# Patient Record
Sex: Female | Born: 1975
Health system: Southern US, Community
[De-identification: ages and names within clinical notes are randomized; demographics above are authoritative.]

## PROBLEM LIST (undated history)

## (undated) DIAGNOSIS — Z975 Presence of (intrauterine) contraceptive device: Secondary | ICD-10-CM

## (undated) DIAGNOSIS — I7300 Raynaud's syndrome without gangrene: Secondary | ICD-10-CM

## (undated) DIAGNOSIS — E079 Disorder of thyroid, unspecified: Secondary | ICD-10-CM

## (undated) DIAGNOSIS — I8390 Asymptomatic varicose veins of unspecified lower extremity: Secondary | ICD-10-CM

## (undated) DIAGNOSIS — R896 Abnormal cytological findings in specimens from other organs, systems and tissues: Secondary | ICD-10-CM

## (undated) DIAGNOSIS — B001 Herpesviral vesicular dermatitis: Secondary | ICD-10-CM

## (undated) DIAGNOSIS — I739 Peripheral vascular disease, unspecified: Secondary | ICD-10-CM

## (undated) HISTORY — DX: Raynaud's syndrome without gangrene: I73.00

## (undated) HISTORY — DX: Herpesviral vesicular dermatitis: B00.1

## (undated) HISTORY — DX: Asymptomatic varicose veins of unspecified lower extremity: I83.90

## (undated) HISTORY — DX: Disorder of thyroid, unspecified: E07.9

## (undated) HISTORY — DX: Presence of (intrauterine) contraceptive device: Z97.5

## (undated) HISTORY — DX: Abnormal cytological findings in specimens from other organs, systems and tissues: R89.6

## (undated) HISTORY — PX: OTHER SURGICAL HISTORY: SHX169

## (undated) HISTORY — DX: Peripheral vascular disease, unspecified: I73.9

---
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
## 1999-10-02 ENCOUNTER — Other Ambulatory Visit: Admission: RE | Admit: 1999-10-02 | Discharge: 1999-10-02 | Payer: Self-pay | Admitting: Obstetrics and Gynecology

## 2000-10-06 ENCOUNTER — Other Ambulatory Visit: Admission: RE | Admit: 2000-10-06 | Discharge: 2000-10-06 | Payer: Self-pay | Admitting: Obstetrics and Gynecology

## 2001-06-07 ENCOUNTER — Other Ambulatory Visit: Admission: RE | Admit: 2001-06-07 | Discharge: 2001-06-07 | Payer: Self-pay | Admitting: Gynecology

## 2001-06-10 ENCOUNTER — Inpatient Hospital Stay (HOSPITAL_COMMUNITY): Admission: AD | Admit: 2001-06-10 | Discharge: 2001-06-10 | Payer: Self-pay | Admitting: Gynecology

## 2002-01-04 ENCOUNTER — Inpatient Hospital Stay (HOSPITAL_COMMUNITY): Admission: AD | Admit: 2002-01-04 | Discharge: 2002-01-07 | Payer: Self-pay | Admitting: Gynecology

## 2002-01-04 ENCOUNTER — Encounter (INDEPENDENT_AMBULATORY_CARE_PROVIDER_SITE_OTHER): Payer: Self-pay | Admitting: *Deleted

## 2002-01-09 ENCOUNTER — Encounter: Admission: RE | Admit: 2002-01-09 | Discharge: 2002-02-08 | Payer: Self-pay | Admitting: Gynecology

## 2002-02-15 ENCOUNTER — Other Ambulatory Visit: Admission: RE | Admit: 2002-02-15 | Discharge: 2002-02-15 | Payer: Self-pay | Admitting: Gynecology

## 2003-05-09 ENCOUNTER — Other Ambulatory Visit: Admission: RE | Admit: 2003-05-09 | Discharge: 2003-05-09 | Payer: Self-pay | Admitting: Gynecology

## 2003-10-09 ENCOUNTER — Other Ambulatory Visit: Admission: RE | Admit: 2003-10-09 | Discharge: 2003-10-09 | Payer: Self-pay | Admitting: Gynecology

## 2004-04-28 ENCOUNTER — Encounter (INDEPENDENT_AMBULATORY_CARE_PROVIDER_SITE_OTHER): Payer: Self-pay | Admitting: Specialist

## 2004-04-28 ENCOUNTER — Inpatient Hospital Stay (HOSPITAL_COMMUNITY): Admission: AD | Admit: 2004-04-28 | Discharge: 2004-05-01 | Payer: Self-pay | Admitting: Gynecology

## 2004-06-26 ENCOUNTER — Other Ambulatory Visit: Admission: RE | Admit: 2004-06-26 | Discharge: 2004-06-26 | Payer: Self-pay | Admitting: Gynecology

## 2005-06-21 DIAGNOSIS — IMO0001 Reserved for inherently not codable concepts without codable children: Secondary | ICD-10-CM

## 2005-06-21 HISTORY — DX: Reserved for inherently not codable concepts without codable children: IMO0001

## 2005-07-02 ENCOUNTER — Other Ambulatory Visit: Admission: RE | Admit: 2005-07-02 | Discharge: 2005-07-02 | Payer: Self-pay | Admitting: Gynecology

## 2005-09-04 ENCOUNTER — Ambulatory Visit (HOSPITAL_BASED_OUTPATIENT_CLINIC_OR_DEPARTMENT_OTHER): Admission: RE | Admit: 2005-09-04 | Discharge: 2005-09-04 | Payer: Self-pay | Admitting: Gynecology

## 2005-09-04 ENCOUNTER — Encounter (INDEPENDENT_AMBULATORY_CARE_PROVIDER_SITE_OTHER): Payer: Self-pay | Admitting: Specialist

## 2006-04-09 ENCOUNTER — Other Ambulatory Visit: Admission: RE | Admit: 2006-04-09 | Discharge: 2006-04-09 | Payer: Self-pay | Admitting: Gynecology

## 2006-06-28 ENCOUNTER — Emergency Department (HOSPITAL_COMMUNITY): Admission: EM | Admit: 2006-06-28 | Discharge: 2006-06-28 | Payer: Self-pay | Admitting: Family Medicine

## 2006-10-19 ENCOUNTER — Ambulatory Visit: Payer: Self-pay | Admitting: Internal Medicine

## 2006-10-19 LAB — CONVERTED CEMR LAB
ALT: 15 units/L (ref 0–35)
AST: 19 units/L (ref 0–37)
Albumin: 4.2 g/dL (ref 3.5–5.2)
Alkaline Phosphatase: 68 units/L (ref 39–117)
Amylase: 29 units/L (ref 27–131)
BUN: 8 mg/dL (ref 6–23)
Bacteria, UA: NEGATIVE
Basophils Absolute: 0 10*3/uL (ref 0.0–0.1)
Basophils Relative: 0.4 % (ref 0.0–1.0)
Bilirubin Urine: NEGATIVE
Bilirubin, Direct: 0.1 mg/dL (ref 0.0–0.3)
CO2: 29 meq/L (ref 19–32)
Calcium: 9.5 mg/dL (ref 8.4–10.5)
Chloride: 103 meq/L (ref 96–112)
Creatinine, Ser: 0.6 mg/dL (ref 0.4–1.2)
Crystals: NEGATIVE
Eosinophils Absolute: 0.1 10*3/uL (ref 0.0–0.6)
Eosinophils Relative: 1.7 % (ref 0.0–5.0)
GFR calc Af Amer: 148 mL/min
GFR calc non Af Amer: 122 mL/min
Glucose, Bld: 98 mg/dL (ref 70–99)
HCT: 40.5 % (ref 36.0–46.0)
Hemoglobin, Urine: NEGATIVE
Hemoglobin: 14 g/dL (ref 12.0–15.0)
Ketones, ur: NEGATIVE mg/dL
Leukocytes, UA: NEGATIVE
Lipase: 23 units/L (ref 11.0–59.0)
Lymphocytes Relative: 30.1 % (ref 12.0–46.0)
MCHC: 34.6 g/dL (ref 30.0–36.0)
MCV: 94 fL (ref 78.0–100.0)
Monocytes Absolute: 0.4 10*3/uL (ref 0.2–0.7)
Monocytes Relative: 5.2 % (ref 3.0–11.0)
Mucus, UA: NEGATIVE
Neutro Abs: 5 10*3/uL (ref 1.4–7.7)
Neutrophils Relative %: 62.6 % (ref 43.0–77.0)
Nitrite: NEGATIVE
Platelets: 185 10*3/uL (ref 150–400)
Potassium: 3.6 meq/L (ref 3.5–5.1)
RBC / HPF: NONE SEEN
RBC: 4.31 M/uL (ref 3.87–5.11)
RDW: 11.9 % (ref 11.5–14.6)
Sed Rate: 8 mm/hr (ref 0–25)
Sodium: 137 meq/L (ref 135–145)
Specific Gravity, Urine: 1.01 (ref 1.000–1.03)
TSH: 2.28 microintl units/mL (ref 0.35–5.50)
Total Bilirubin: 1.2 mg/dL (ref 0.3–1.2)
Total Protein, Urine: NEGATIVE mg/dL
Total Protein: 6.8 g/dL (ref 6.0–8.3)
Urine Glucose: NEGATIVE mg/dL
Urobilinogen, UA: 0.2 (ref 0.0–1.0)
WBC, UA: NONE SEEN cells/hpf
WBC: 7.8 10*3/uL (ref 4.5–10.5)
pH: 7 (ref 5.0–8.0)

## 2006-11-08 ENCOUNTER — Ambulatory Visit (HOSPITAL_COMMUNITY): Admission: RE | Admit: 2006-11-08 | Discharge: 2006-11-08 | Payer: Self-pay | Admitting: Internal Medicine

## 2007-04-26 ENCOUNTER — Other Ambulatory Visit: Admission: RE | Admit: 2007-04-26 | Discharge: 2007-04-26 | Payer: Self-pay | Admitting: Gynecology

## 2007-07-01 ENCOUNTER — Emergency Department (HOSPITAL_COMMUNITY): Admission: EM | Admit: 2007-07-01 | Discharge: 2007-07-01 | Payer: Self-pay | Admitting: Emergency Medicine

## 2007-07-05 DIAGNOSIS — R109 Unspecified abdominal pain: Secondary | ICD-10-CM | POA: Insufficient documentation

## 2007-07-05 DIAGNOSIS — R1110 Vomiting, unspecified: Secondary | ICD-10-CM | POA: Insufficient documentation

## 2007-07-05 DIAGNOSIS — N840 Polyp of corpus uteri: Secondary | ICD-10-CM | POA: Insufficient documentation

## 2007-07-05 DIAGNOSIS — K828 Other specified diseases of gallbladder: Secondary | ICD-10-CM | POA: Insufficient documentation

## 2007-11-22 IMAGING — US US ABDOMEN COMPLETE
1 series · 14 of 25 positions shown · non-contrast
Comparison: None.

CLINICAL DATA: Abdominal pain.  
 ABDOMEN ULTRASOUND:
TECHNIQUE: Complete abdominal ultrasound examination was performed including evaluation of the liver, gallbladder, bile ducts, pancreas, kidneys, spleen, IVC, and abdominal aorta.

[Series 1: unknown · 0.27mm/px · 14 of 73 slices shown]
[im 1/73]
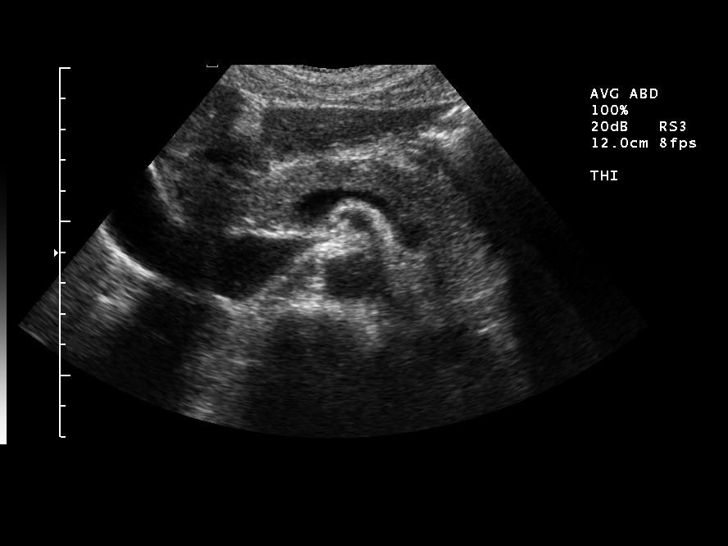
[im 7/73]
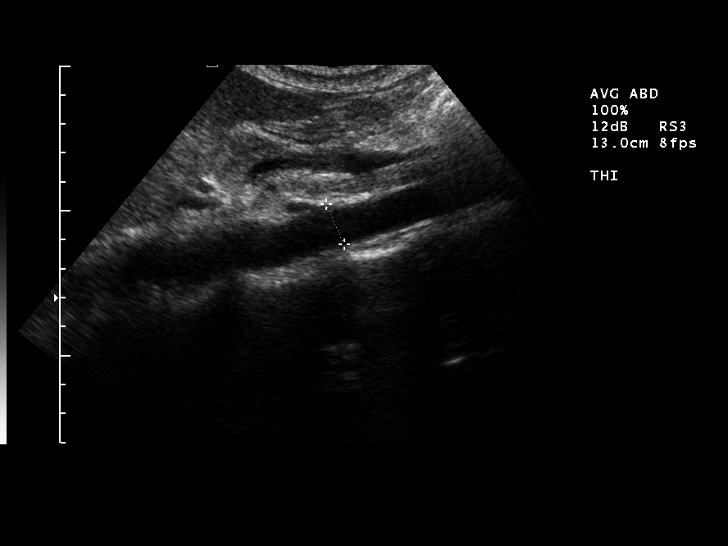
[im 13/73]
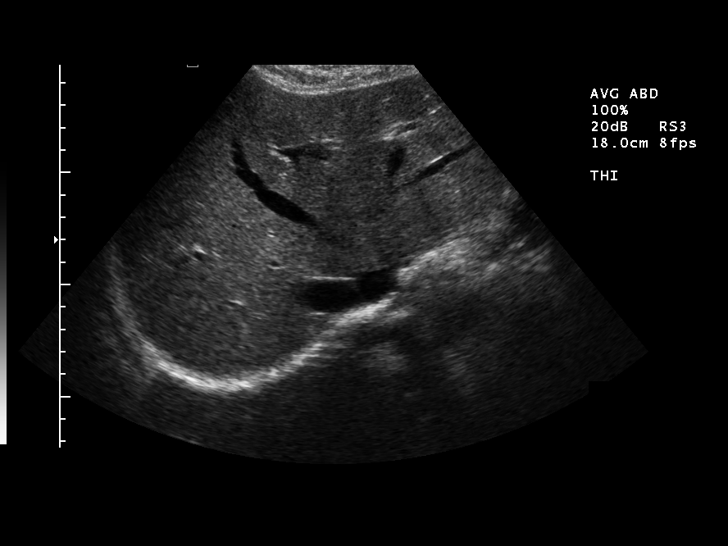
[im 19/73]
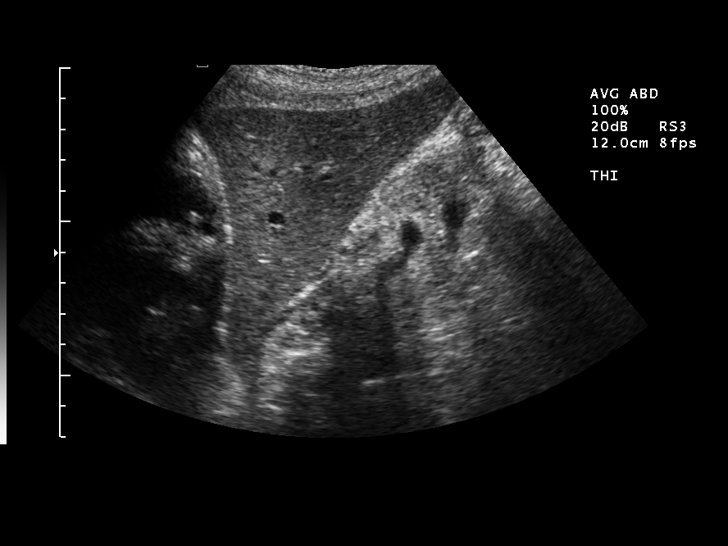
[im 25/73]
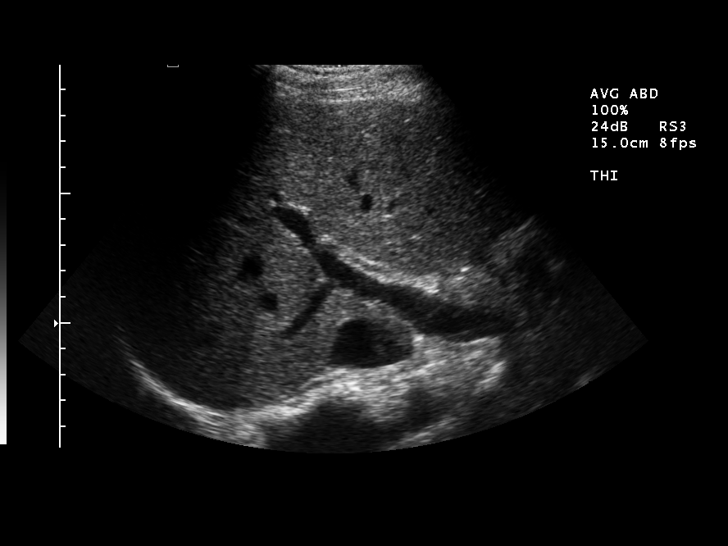
[im 28/73]
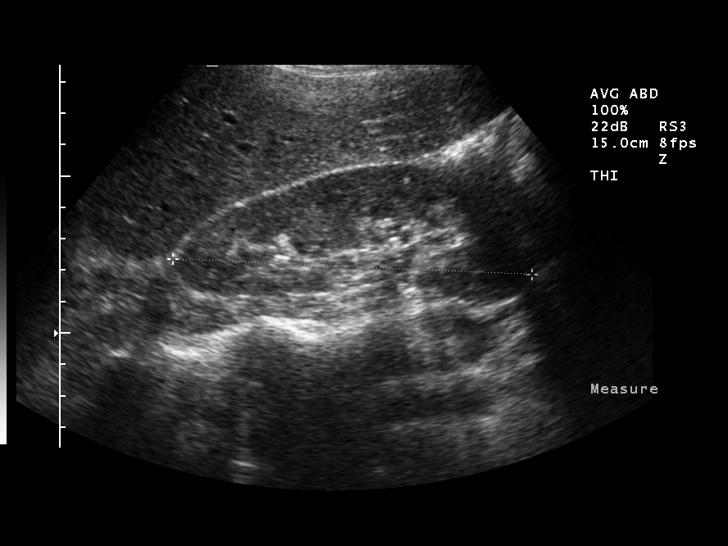
[im 34/73]
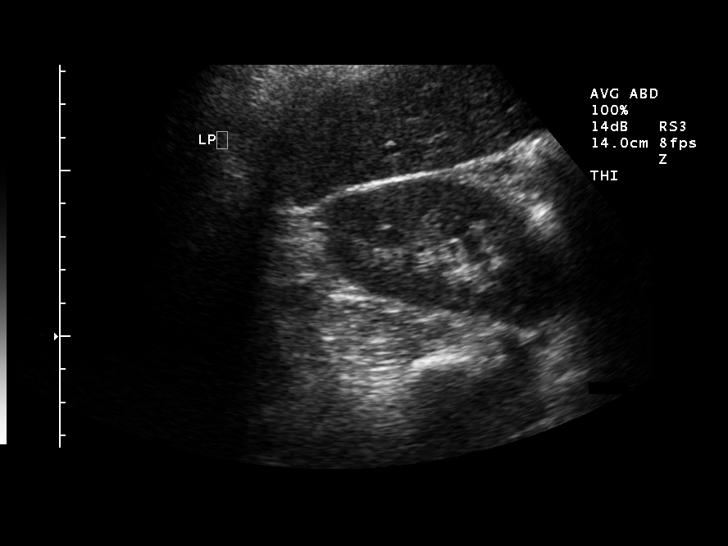
[im 40/73]
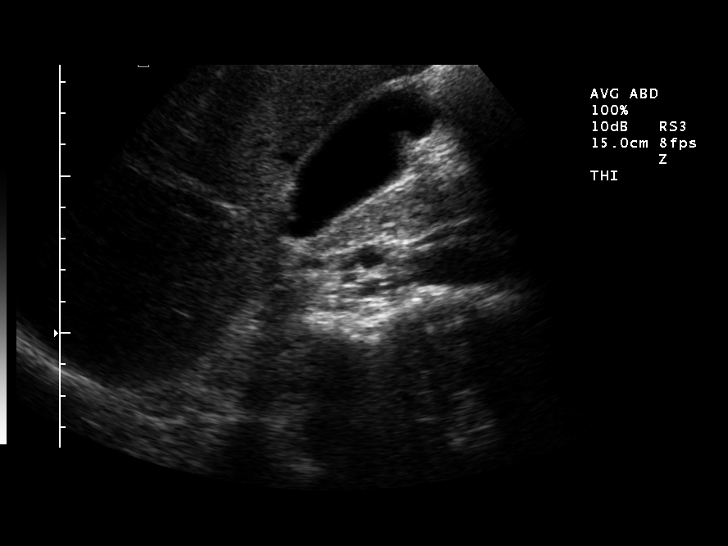
[im 46/73]
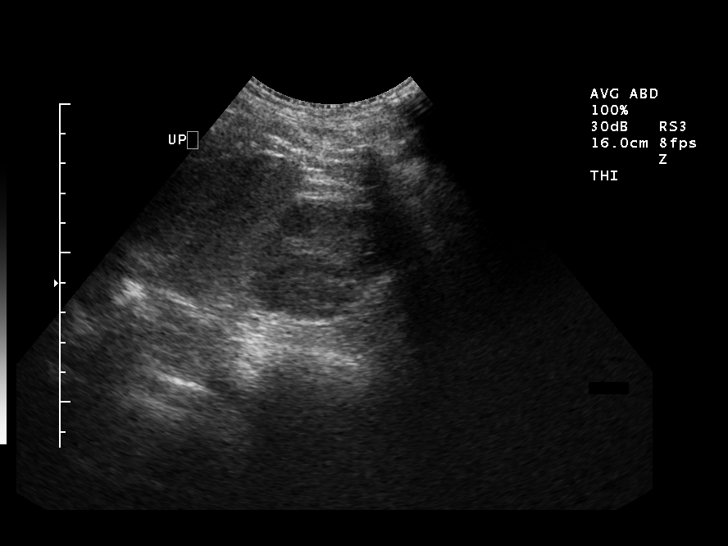
[im 49/73]
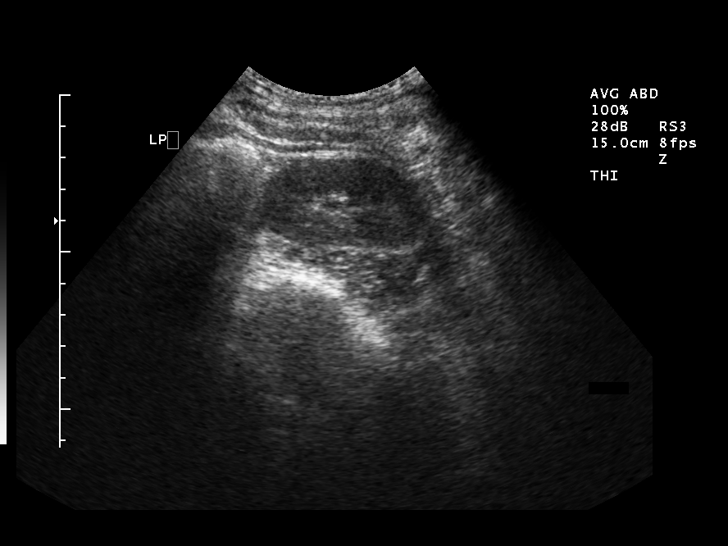
[im 55/73]
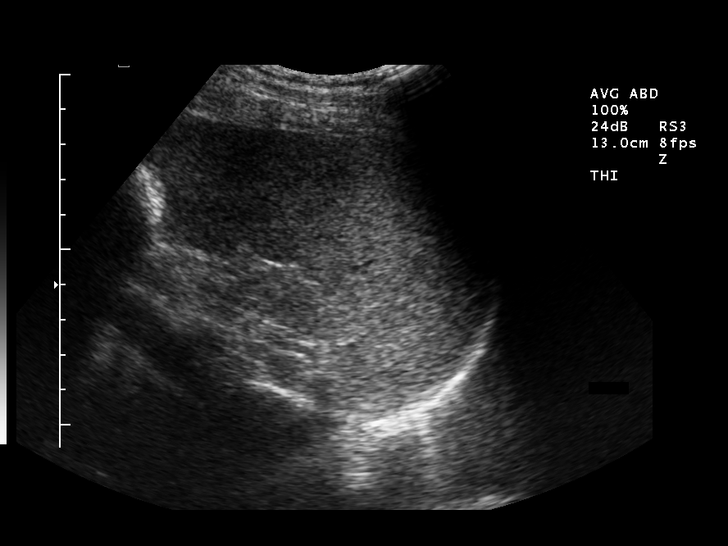
[im 61/73]
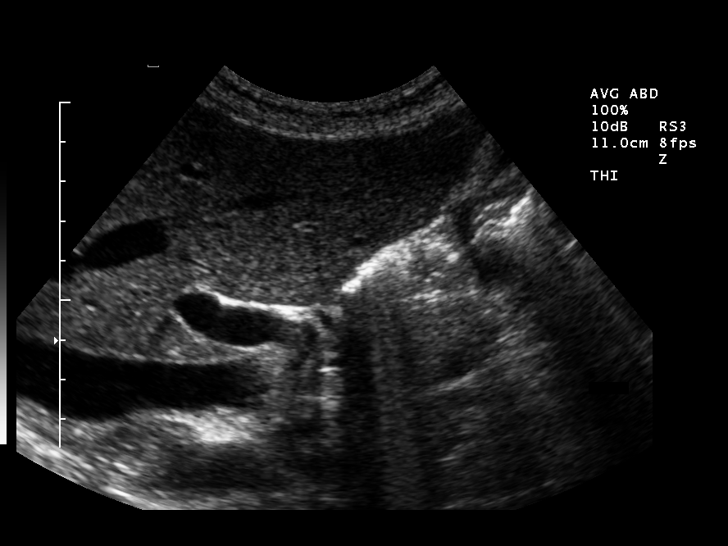
[im 67/73]
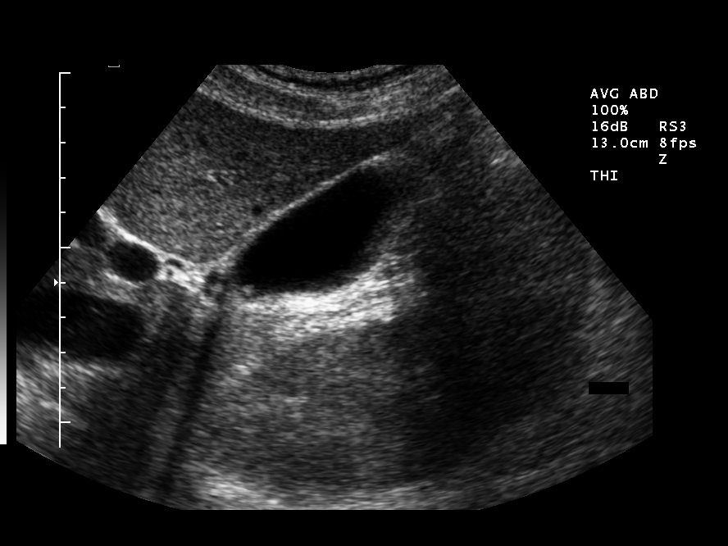
[im 73/73]
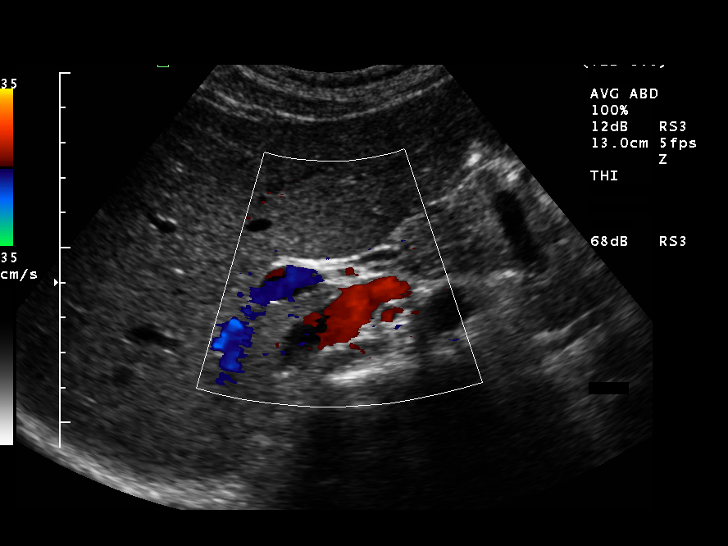

[14 of 25 positions shown; findings below may reference images not displayed]

FINDINGS: Gallbladder size and contour normal.  There are two small polyps of the gallbladder wall measuring about 3 mm.  No definite changes of adenomyomatosis.  No stones or wall thickening.  No focal tenderness over the gallbladder. 
 Intra and extrahepatic bile ducts normal in caliber.  Pancreas, spleen, kidneys, aorta, and IVC normal.  No ascites.
IMPRESSION: Normal except for several small gallbladder polyps.

## 2008-07-14 IMAGING — CT CT HEAD W/O CM
1 series · 16 of 28 positions shown, 20 images · non-contrast
Comparison: None

CLINICAL DATA: Headache, confusion, history of seizures for many
years

CT HEAD WITHOUT CONTRAST
TECHNIQUE: Contiguous axial images were obtained from the base of
the skull through the vertex without contrast.

[Series 2: brain · axial · 0.47mm/px · z∈[+95,+220]mm · 16 of 28 slices shown, 20 images]
[im 2/28  brain]
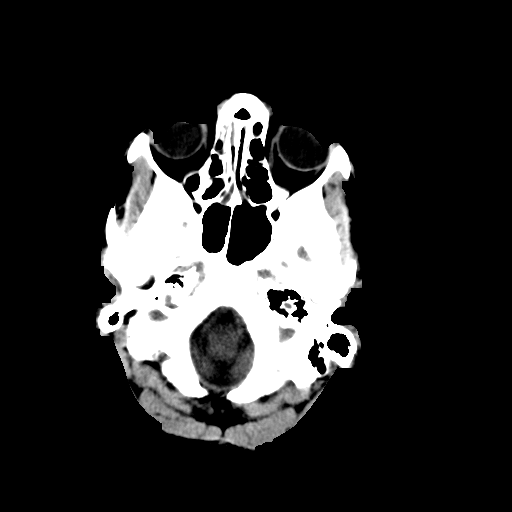
[im 2/28  bone]
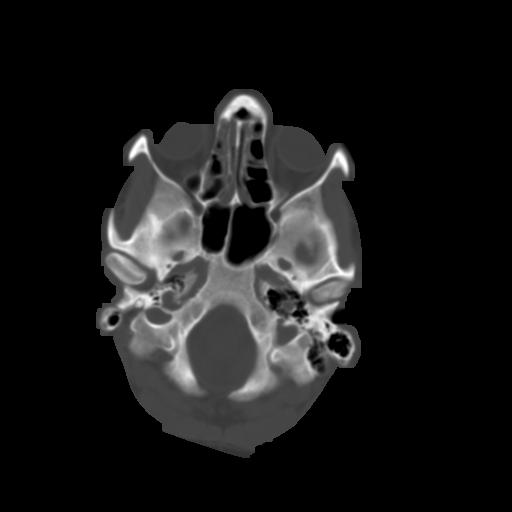
[im 4/28  brain]
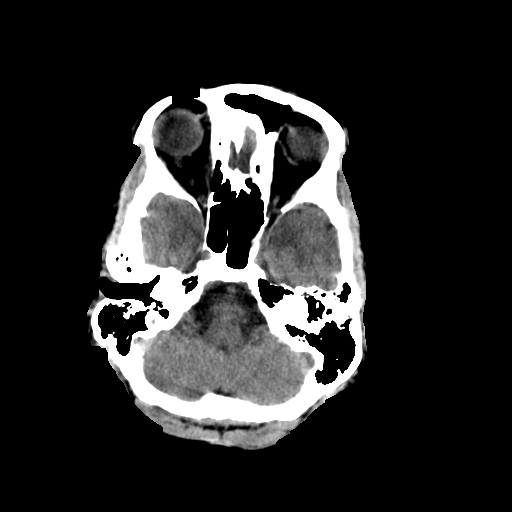
[im 6/28  brain]
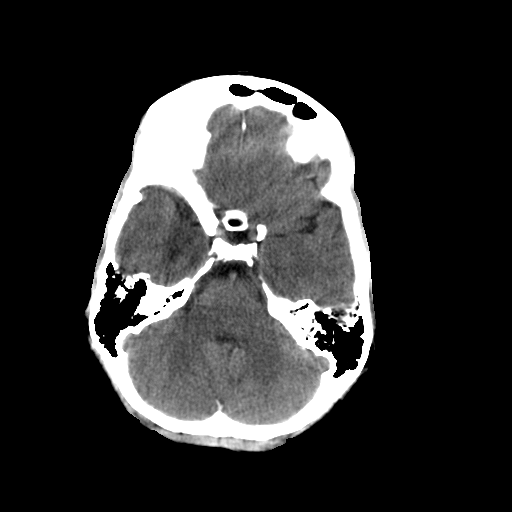
[im 7/28  brain]
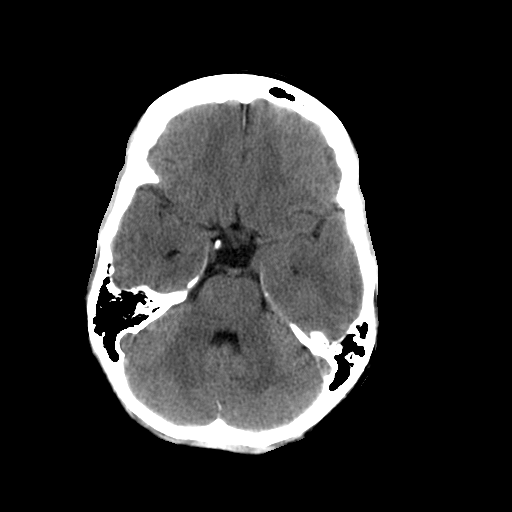
[im 9/28  brain]
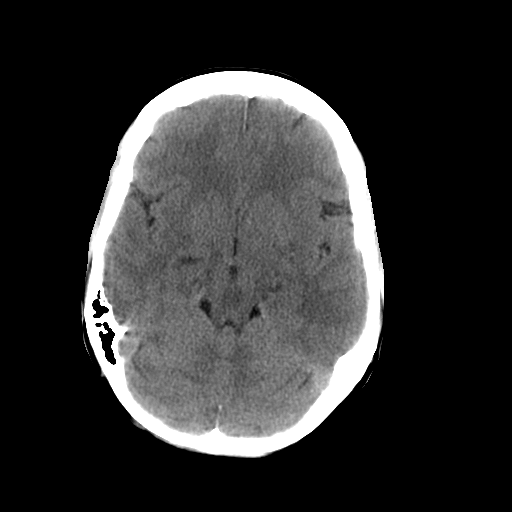
[im 9/28  bone]
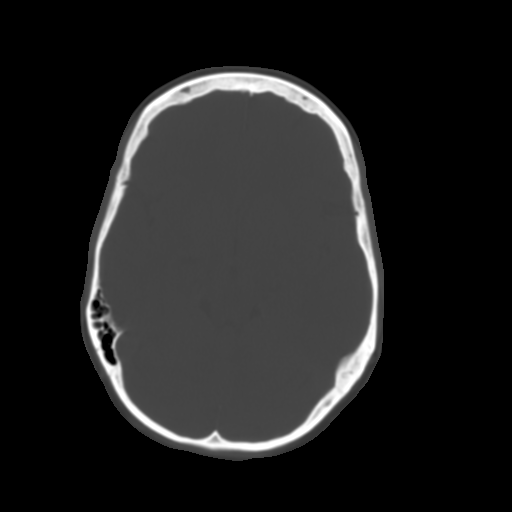
[im 10/28  brain]
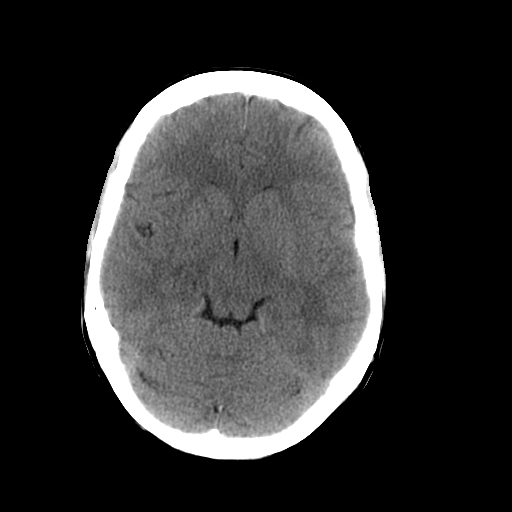
[im 12/28  brain]
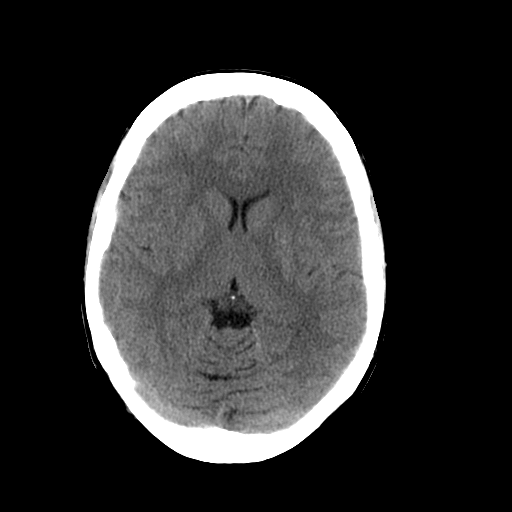
[im 14/28  brain]
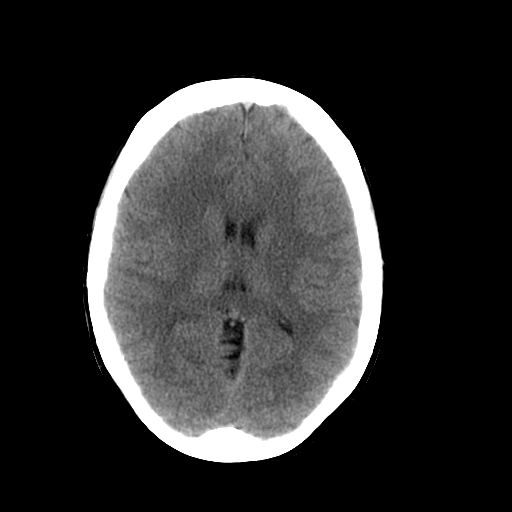
[im 15/28  brain]
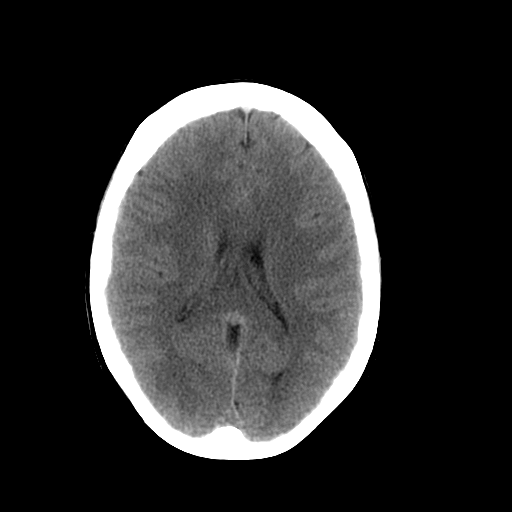
[im 15/28  bone]
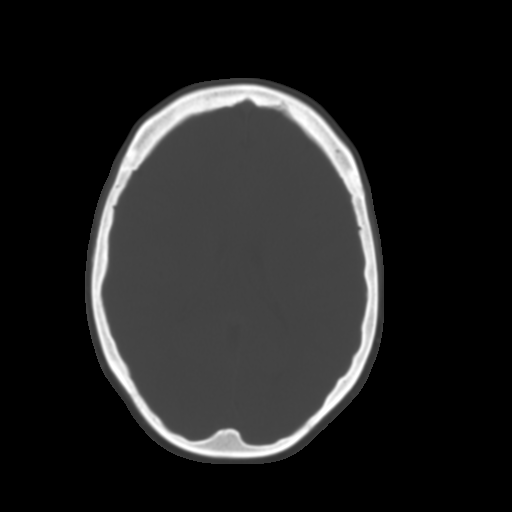
[im 17/28  brain]
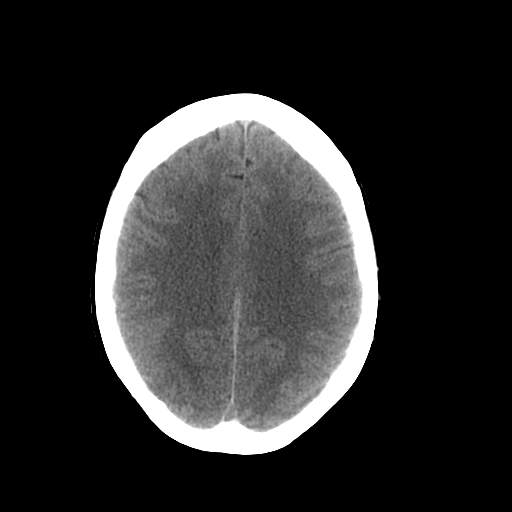
[im 19/28  brain]
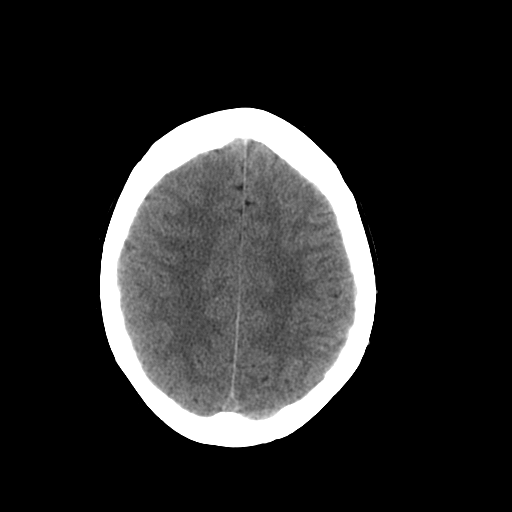
[im 20/28  brain]
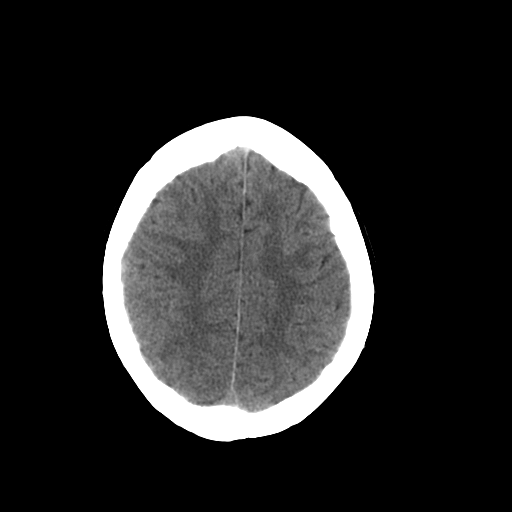
[im 22/28  brain]
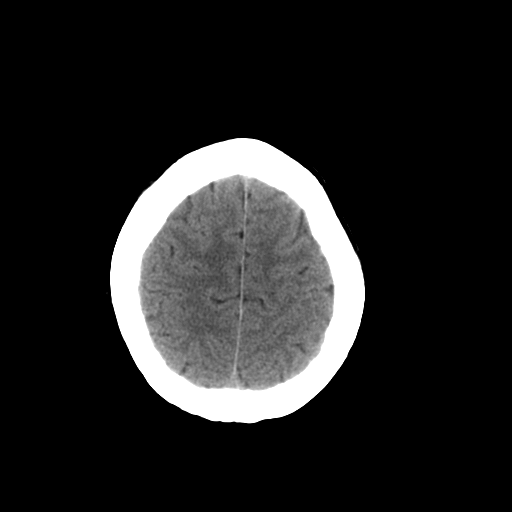
[im 22/28  bone]
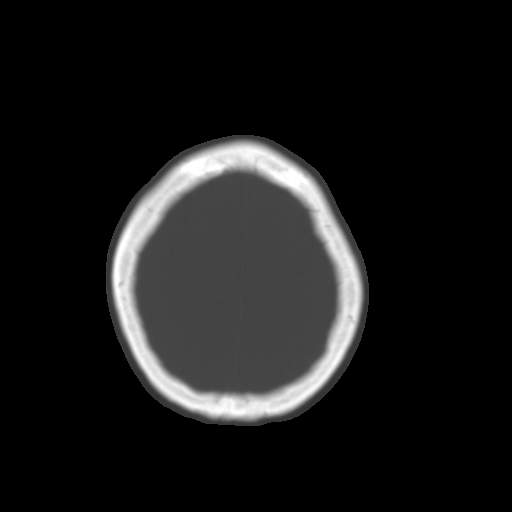
[im 23/28  brain]
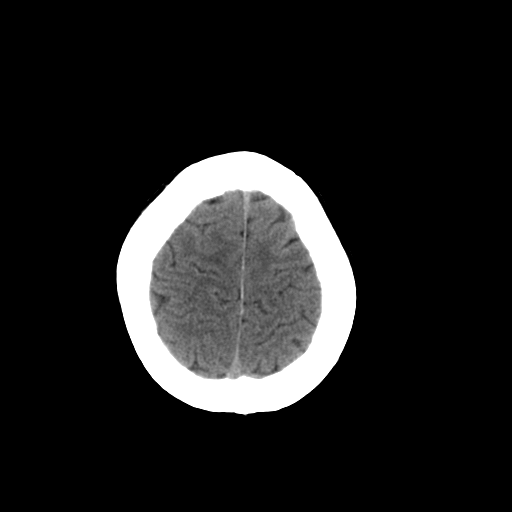
[im 25/28  brain]
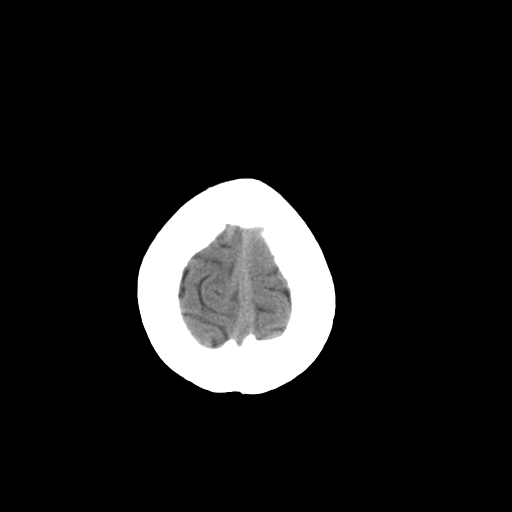
[im 27/28  brain]
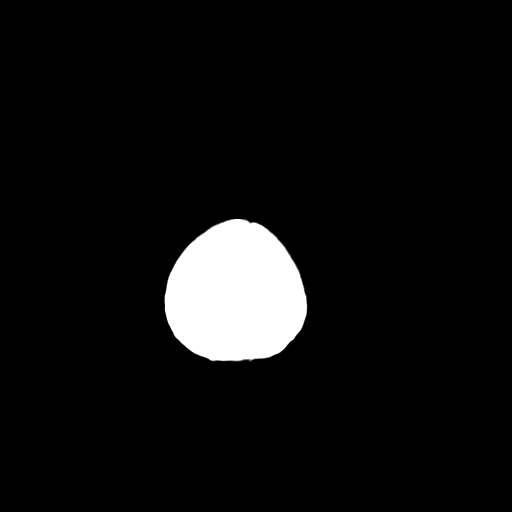

[16 of 28 positions shown; findings below may reference images not displayed]

FINDINGS: The ventricular system is normal in size and
configuration and the septum is in a normal midline position.  The
fourth ventricle and basilar cisterns appear normal.  No blood,
edema, or mass effect is seen.  On bone window images no bony
abnormality is seen.  However, there is evidence of ethmoid sinus
disease.
IMPRESSION: 1.  No acute intracranial abnormality.
2.  Ethmoid sinus disease.

## 2008-12-07 ENCOUNTER — Encounter: Payer: Self-pay | Admitting: Gynecology

## 2008-12-07 ENCOUNTER — Other Ambulatory Visit: Admission: RE | Admit: 2008-12-07 | Discharge: 2008-12-07 | Payer: Self-pay | Admitting: Gynecology

## 2008-12-07 ENCOUNTER — Ambulatory Visit: Payer: Self-pay | Admitting: Gynecology

## 2010-03-10 ENCOUNTER — Ambulatory Visit: Payer: Self-pay | Admitting: Gynecology

## 2010-03-10 ENCOUNTER — Other Ambulatory Visit
Admission: RE | Admit: 2010-03-10 | Discharge: 2010-03-10 | Payer: Self-pay | Source: Home / Self Care | Admitting: Gynecology

## 2010-03-24 ENCOUNTER — Ambulatory Visit: Payer: Self-pay | Admitting: Gynecology

## 2010-06-17 ENCOUNTER — Ambulatory Visit (INDEPENDENT_AMBULATORY_CARE_PROVIDER_SITE_OTHER): Payer: 59 | Admitting: Gynecology

## 2010-06-17 DIAGNOSIS — N390 Urinary tract infection, site not specified: Secondary | ICD-10-CM

## 2010-06-17 DIAGNOSIS — J018 Other acute sinusitis: Secondary | ICD-10-CM

## 2010-06-17 DIAGNOSIS — R823 Hemoglobinuria: Secondary | ICD-10-CM

## 2010-08-04 HISTORY — PX: HYSTEROSCOPY WITH D & C: SHX1775

## 2010-08-05 NOTE — Assessment & Plan Note (Signed)
[facility]                         GASTROENTEROLOGY OFFICE NOTE   NAME:Gikas, DEMAYA HARDGE                     MRN:          [id number]  DATE:10/19/2006                            DOB:          1975/06/15    The patient is self-referred.   REASON FOR CONSULTATION:  Abdominal pain.   HISTORY OF PRESENT ILLNESS:  This is a 35 year old white female with no  significant past medical history, who presents herself today for  evaluation of persistent recurrent abdominal pain and vomiting.  For the  most part, she has been quite healthy.  She does report developing  reflux symptoms at the time of her most recent pregnancy about 2-1/2  years ago.  Postpartum, symptoms resolved.  She did well until about 6  or 7 months ago, when she began to notice a burning sensation in the  pharyngeal region.  As well, a new type of discomfort described as a  burning in the left upper quadrant.  She initiated Prilosec OTC and had  improvement in the pharyngeal burning; however, the abdominal burning  discomfort persisted.  She also reports that many hours after consuming  wine or coffee, she will develop sharp abdominal pain and subsequent  vomiting.  She has awoken with symptoms.  She can see minor amounts of  blood with repeated episodes of vomiting.  She denies vomiting  undigested food.  Bowel habits are regular without melena or  hematochezia.  Her appetite and weight are unchanged.  The pain does not  radiate.  Current frequency of severe pain with vomiting is about once  per week.  However, she has not had any problems in the past two weeks;  she attributes this to abstinence from alcohol  She denies nonsteroidal  anti-inflammatory drug use.  The patient does feel that shortly after  consuming meals, her discomfort might be improved.   PAST MEDICAL HISTORY:  None.   PAST SURGICAL HISTORY:  1. Cesarean section x2.  2. Extraction of benign polyps from the uterus in  2007.   ALLERGIES:  SULFA.   CURRENT MEDICATIONS:  None.   FAMILY HISTORY:  Father with colon polyps. No panreatitis.   SOCIAL HISTORY:  The patient is married with 2 children.  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  She does not  smoke.  Normally with consume 1-3 glasses of wine during the week, more  on weekends or holidays.   REVIEW OF SYSTEMS:  Per diagnostic evaluation form.   PHYSICAL EXAMINATION:  A well-appearing female in no acute distress.  She is alert and oriented.  Blood pressure is 116/64.  Heart rate is 80 and regular.  Weight is  133.4 pounds.  Height:  She is 5 feet 3-1/2 inches in height.  HEENT:  Sclerae are anicteric.  Conjunctivae are pink.  Oral mucosa is  intact.  There is no adenopathy.  LUNGS:  Clear.  HEART:  Regular.  ABDOMEN:  Soft without tenderness, mass or hernia.  Good bowel sounds  heard.  EXTREMITIES:  Without edema.   IMPRESSION:  This is a  35 year old female with a 6- or 87-month history  of left upper quadrant burning discomfort as well as intermittent sharp  left upper quadrant discomfort followed by nausea and vomiting, symptoms  seemingly exacerbated after consumption of alcohol, possibly improved  with meals.  We will rule out peptic ulcer disease.  Rule out  incompletely treated reflux disease.  Rule out pancreatic, biliary, or  urologic processes.   RECOMMENDATIONS:  1. General laboratories.  2. Urinalysis.  3. Abdominal ultrasound.  4. Upper endoscopy.  5. Empirically initiate Prevacid 30 mg daily.  6. Provide a prescription for Phenergan 25 mg q.6 h. p.r.n. nausea.  7. It should be noted that the patient is going to [place] for two      weeks and will not be able to complete her endoscopy or ultrasound      until returning.     Wilhemina Bonito. Marina Goodell, MD  Electronically Signed    JNP/MedQ  DD: 10/19/2006  DT: 10/20/2006  Job #: [id number]

## 2010-08-08 NOTE — H&P (Signed)
   NAME:  MACK, THURMON                        ACCOUNT NO.:  [id number]   MEDICAL RECORD NO.:  [id number]                   PATIENT TYPE:  INP   LOCATION:  9198                                 FACILITY:  [facility]   PHYSICIAN:  Timothy P. Fontaine, M.D.           DATE OF BIRTH:  1975-10-28   DATE OF ADMISSION:  01/04/2002  DATE OF DISCHARGE:                                HISTORY & PHYSICAL   CHIEF COMPLAINT:  Pregnancy at term, prodromal labor, active HSV outbreak,  positive beta strep carrier.   HISTORY OF PRESENT ILLNESS:  A 35 year old G1, P0 female at term gestation  who presented complaining of prodromal labor.  The patient was found to have  irregular contractions with reactive fetal tracing.  The patient also had  noted over the last several day an itching and tingling on her perineum  consistent with her HSV outbreaks, and on perineal inspection was found to  have several superficial ulcerations on the perineal body consistent with  active HSV.  The options were discussed and she wants to proceed with  cesarean section.   PAST MEDICAL HISTORY:  Seizure disorder for which she is on no medications  and has had no recent seizures.   PAST SURGICAL HISTORY:  Wisdom teeth extraction.   ALLERGIES:  SULFA DRUGS.   REVIEW OF SYSTEMS:  Noncontributory.   SOCIAL HISTORY:  Noncontributory.   FAMILY HISTORY:  Noncontributory.   PHYSICAL EXAMINATION:  ABDOMEN AND PELVIS:  Gravid, vertex presentation,  reactive fetal tracing, irregular contractions.  Perineal inspection with  active HSV lesions.   ASSESSMENT:  A 35 year old G1, P0 female, term gestation, active herpes  simplex virus, prodromal labor.  Options were reviewed.  The patient wants  to proceed with cesarean section.  Risks of cesarean section were discussed  with the patient and her husband to include bleeding, transfusion,  infection, wound complications, opening and draining of incisions, closure  by secondary  intention, internal organ damage including bowel, bladder,  ureters, vessels, and nerves necessitating major exploratory reparative  surgeries in the future, reparative surgeries as well fetal injury.  The  patient's questions are answered to her satisfaction.  She is ready to  proceed with surgery.                                                 Timothy P. Audie Box, M.D.    TPF/MEDQ  D:  01/04/2002  T:  01/04/2002  Job:  [id number]

## 2010-08-08 NOTE — Op Note (Signed)
NAMECLEVELAND, Theresa Mosley              ACCOUNT NO.:  [id number]   MEDICAL RECORD NO.:  [id number]          PATIENT TYPE:  INP   LOCATION:  9147                          [facility]ACILITY:  [facility]   PHYSICIAN:  Timothy P. Fontaine, M.D.DATE OF BIRTH:  [birth year]   DATE OF PROCEDURE:  04/28/2004  DATE OF DISCHARGE:                                 OPERATIVE REPORT   PREOPERATIVE DIAGNOSIS:  Term pregnancy, prior cesarean section, repeat  cesarean section, positive beta Strep carrier, history of HSV type 2  currently inactive.   POSTOPERATIVE DIAGNOSIS:  Term pregnancy, prior cesarean section, repeat  cesarean section, positive beta Strep carrier, history of HSV type 2  currently inactive.   PROCEDURE:  Repeat low transverse cervical cesarean section.   SURGEON:  Timothy P. Fontaine, M.D.   ASSISTANT:  Scrub technician.   ANESTHESIA:  Spinal.   ESTIMATED BLOOD LOSS:  Less than 500 mL.   COMPLICATIONS:  None.   SPECIMENS:  Samples of cord blood, placenta.   FINDINGS:  At 35, normal female infant, Apgars 9 and 9, weight 8 pounds 14  ounce.  Pelvic anatomy noted to be normal.   INDICATIONS FOR PROCEDURE:  35 year old G43, P1 female at [redacted] weeks gestation,  history of prior cesarean section, who desired trial of labor.  The patient  underwent spontaneous rupture of membranes, subsequently required pitocin  augmentation. Despite an adequate labor pattern, the patient's cervix  remained unchanged and the decision was made to proceed with cesarean  section.  The risks of the procedure were reviewed with her to include the  risks of bleeding, transfusion, infection, wound complications, damage to  internal organs including bowel, bladder, ureters, vessels, and nerves,  necessitating major exploratory reparative surgery and future reparative  surgery, as well as fetal injury during the birthing process, all of which  was understood and accepted.   PROCEDURE:  The patient was taken to the operating  room, underwent spinal  anesthesia, was placed in the left tilt supine position, received an  abdominal preparation with Betadine solution.  The bladder was emptied with  indwelling Foley catheterization placed using sterile technique.  The  patient was prepped and draped in the usual fashion.  After assuring  adequate anesthesia, the abdomen was sharply entered through a repeat  Pfannenstiel incision achieving adequate hemostasis at all levels.  The  bladder flap was sharply and bluntly developed and the uterus was sharply  entered in the lower uterine segment bluntly extended laterally.  The  bulging membranes were ruptured, the fluid noted to be clear.  The infants  head was delivered through the incision and the nares and mouth were  suctioned.  The rest of the infant was delivered.  The cord was doubly  clamped and cut.  The infant was handed to pediatrics in attendance.  Samples of cord blood were obtained.  The placenta was spontaneously  extracted and noted to be intact.  The uterus was exteriorized, the  endometrial cavity explored with a sponge to remove all placental membrane  fragments.  The uterine incision was then closed in one layer using 0 Vicryl  suture in a running interlocking stitch.  The uterus was returned to the  abdomen which was copiously irrigated.  Adequate hemostasis was visualized.  The anterior fascia was reapproximated using 0 Vicryl suture in a running  stitch.  The subcutaneous tissue were irrigated, adequate hemostasis was  achieved with electrocautery.  The skin was reapproximated using 4-0 Vicryl  in a running subcuticular stitch.  Steri-Strips with Benzoin were applied,  sterile dressing was applied.  The patient was taken to the recovery room in  good condition having tolerated the procedure well.      TPF/MEDQ  D:  04/28/2004  T:  04/28/2004  Job:  [id number]

## 2010-08-08 NOTE — H&P (Signed)
Theresa Mosley, Theresa Mosley              ACCOUNT NO.:  [id number]   MEDICAL RECORD NO.:  [id number]          PATIENT TYPE:  INP   LOCATION:  NA                            [facility]   PHYSICIAN:  Timothy P. Fontaine, M.D.DATE OF BIRTH:  06-18-1975   DATE OF ADMISSION:  04/28/2004  DATE OF DISCHARGE:                                HISTORY & PHYSICAL   CHIEF COMPLAINT:  1.  Gross rupture of membranes.  2.  Term pregnancy, early labor.   HISTORY OF PRESENT ILLNESS:  A 35 year old G26, P90 female, at 40 weeks with  gross rupture of membranes at approximately 4 a.m. The patient subsequently  had the onset of mild contractions every 3-7 minutes.   PRENATAL COURSE:  She does have a history of a low transverse cervical  cesarean section with her 1st pregnancy and desires a trial of labor. She  also has a history of HSV, type 2, although has not had any outbreaks during  the pregnancy and denies any symptoms at this time. Lastly, she is beta  strep positive. For the remainder of her history, see the [facility].   PHYSICAL EXAMINATION:  HEENT:  Normal.  LUNGS:  Clear.  CARDIAC:  Regular rhythm without rubs, murmurs or gallops.  ABDOMINAL EXAM:  Gravid, vertex consistent with term. External monitor:  Reactive fetus with contractions every 3-7 minutes.  PELVIC EXAM:  Fingertip dilated, 50%, -2 station, clear fluid noted.   PLAN:  IUPC was placed. Discussed the plan with patient and will plan on  trial of labor per her request. Per history and per vulvar exam there are no  active HSV outbreaks. She is beta strep positive and will plan on antibiotic  prophylaxis. I have discussed the Pitocin augmentation with her and she is  in agreement and will go ahead and start Pitocin now. She is 8 hours into  ruptured membranes. The patient agrees with the plan.      TPF/MEDQ  D:  04/28/2004  T:  04/28/2004  Job:  [id number]

## 2010-08-08 NOTE — H&P (Signed)
NAMEANYELI, Theresa Mosley              ACCOUNT NO.:  [id number]   MEDICAL RECORD NO.:  [id number]          PATIENT TYPE:  AMB   LOCATION:  [facility]                         FACILITY:  [facility]   PHYSICIAN:  Timothy P. Fontaine, M.D.DATE OF BIRTH:  07/25/1975   DATE OF ADMISSION:  09/04/2005  DATE OF DISCHARGE:                                HISTORY & PHYSICAL   CHIEF COMPLAINT:  Menorrhagia.   HISTORY OF PRESENT ILLNESS:  A 35 year old gravida 3, para 2 female with  vasectomy birth control who presents complaining of heavier periods.  The  patient underwent outpatient sonohysterogram which showed a thickened  anterior sessile polyp appearing mass in the uterine cavity and is admitted  for hysteroscopy, resection of this area, D&C.   PAST MEDICAL HISTORY:  Is uncomplicated.   PAST SURGICAL HISTORY:  Includes cesarean section x2 and oral surgery.   CURRENT MEDICATIONS:  Multivitamins.   ALLERGIES:  SULFA and WALNUTS.   REVIEW OF SYSTEMS:  Noncontributory.   SOCIAL HISTORY:  Noncontributory.   FAMILY HISTORY:  Noncontributory.   PHYSICAL EXAM:  VITAL SIGNS:  Afebrile.  Vital signs stable.  HEENT: Normal.  LUNGS: Clear.  CARDIAC: Regular rate without rubs, murmurs or gallops.  ABDOMEN:  Benign.  PELVIC: External BUS and vagina normal.  Cervix normal.  Uterus normal size,  retroverted.  Adnexa without masses or tenderness.   ASSESSMENT:  A 35 year old gravida 3, para 2, abortus 1 female with  vasectomy birth control, history of heavier periods with sessile polypoid  appearing mass anterior uterine surface for hysteroscopy, D&C.  I reviewed  the expected intraoperative, postoperative courses, instrumentation,  dilatation, using the resectoscope, sharp curettage were all discussed,  understood, accepted.  The risks of the procedure to include infection,  antibiotics, hemorrhage necessitating transfusion and risks of transfusion  were discussed. The risks of uterine perforation leading to  damage to  internal organs including bowel, bladder, ureters, vessels and nerves  necessitating major exploratory reparative  surgeries and future reparative surgeries including ostomy formation was all  discussed, understood and accepted.  The patient understands there are no  guarantees as far as menorrhagia relief. Her questions were answered to her  satisfaction.  She is ready to proceed with surgery.      Timothy P. Fontaine, M.D.  Electronically Signed     TPF/MEDQ  D:  09/01/2005  T:  09/01/2005  Job:  [id number]

## 2010-08-08 NOTE — Discharge Summary (Signed)
   NAME:  Theresa Mosley, Theresa Mosley                        ACCOUNT NO.:  [id number]   MEDICAL RECORD NO.:  [id number]                   PATIENT TYPE:  INP   LOCATION:  9109                                 FACILITY:  [facility]   PHYSICIAN:  Timothy P. Fontaine, M.D.           DATE OF BIRTH:  09/21/1975   DATE OF ADMISSION:  01/04/2002  DATE OF DISCHARGE:  01/07/2002                                 DISCHARGE SUMMARY   DISCHARGE DIAGNOSES:  1. Intrauterine pregnancy at term.  2. Prodromal labor.  3. Active herpes simplex virus outbreak.   PROCEDURE:  Primary low cervical cesarean section with delivery of viable  infant.   HISTORY OF PRESENT ILLNESS:  The patient is a 35 year old gravida 2, para 0-  0-1-0 with an LMP of March 27, 2001, [facility] January 01, 2002.  Prenatal course  complicated by history of seizure disorder with no recent history of a  seizure and also frequent outbreaks of HSV.   LABORATORIES:  Blood type A+.  Antibody screen negative.  RPR, HBSAG, HIV  nonreactive.  GBS was positive.   HOSPITAL COURSE:  The patient was admitted January 04, 2002 for cesarean  section secondary to prodromal labor and an active HSV outbreak.  Procedure  was performed by Dr. Audie Box.  Under spinal anesthesia patient was  delivered of an Apgar 8 and 51 female infant, 7 pounds 4 ounces, normal pelvic  anatomy.  Postpartum course patient remained afebrile.  Had no difficulty  voiding.  Was able to be discharged in satisfactory condition on her third  postoperative day.  CBC:  Hematocrit 33.5, hemoglobin 11.7, WBC 10.6,  platelets 129,000.   DISPOSITION:  Follow up in six weeks.  Continue with prenatal vitamins and  iron.  Tylox p.o., Zovirax cream.     Elwyn Lade . Hancock, N.P.                Timothy P. Audie Box, M.D.    MKH/MEDQ  D:  01/30/2002  T:  01/30/2002  Job:  [id number]

## 2010-08-08 NOTE — Discharge Summary (Signed)
NAMEVADA, SWIFT              ACCOUNT NO.:  [id number]   MEDICAL RECORD NO.:  [id number]          PATIENT TYPE:  INP   LOCATION:  9147                          [facility]ILITY[facility]   PHYSICIAN:  Timothy P. Fontaine, M.D.DATE OF BIRTH:  1975-12-09   DATE OF ADMISSION:  04/28/2004  DATE OF DISCHARGE:  05/01/2004                                 DISCHARGE SUMMARY   DISCHARGE DIAGNOSES:  1.  Pregnancy at term.  2.  Spontaneous rupture of membranes.  3.  Failure to progress.  4.  Repeat low transverse cervical cesarean section.  5.  Positive beta-Streptococcus carrier.  6.  History of herpes simplex virus type 2, currently inactive.   PROCEDURE:  Repeat low transverse cervical cesarean section on April 28, 2004.   HOSPITAL COURSE:  A 35 year old, G35, P57 female, 62 weeks' gestation with  history of prior cesarean section who elected for trial of labor.  The  patient was admitted with grossly ruptured membranes and mild contractions  every 3-7 minutes.  The patient's cervix remained unchanged and it was  decided to proceed with a repeat cesarean section.  The patient underwent an  uncomplicated repeat cesarean section on April 28, 2004, producing a  normal female infant, Apgar's 9 and 9, weight 8 pounds 14 ounces.  The  patient's postoperative course was uncomplicated and she was discharged on  postop day #3, ambulating well, tolerating a regular diet with a  postoperative hemoglobin of 10.7.  The patient received precautions,  instructions and followup.   FOLLOW UP:  She will be seen in the office in 6 weeks.   DISCHARGE MEDICATIONS:  Tylox #25 one to two p.o. q.4-6h. p.r.n. pain.   SPECIAL INSTRUCTIONS:  The patient's blood type is A positive and she is  rubella titer positive.      TPF/MEDQ  D:  05/01/2004  T:  05/01/2004  Job:  [id number]

## 2010-08-08 NOTE — Op Note (Signed)
NAME:  Theresa Mosley, Theresa Mosley                        ACCOUNT NO.:  [id number]   MEDICAL RECORD NO.:  [id number]                   PATIENT TYPE:  INP   LOCATION:  9198                                 [facility]ACILITY:  [facility]   PHYSICIAN:  Timothy P. Fontaine, M.D.           DATE OF BIRTH:  Apr 17, 1975   DATE OF PROCEDURE:  01/04/2002  DATE OF DISCHARGE:                                 OPERATIVE REPORT   PREOPERATIVE DIAGNOSES:  1. Intrauterine pregnancy at term.  2. Prodromal labor.  3. Active herpes simplex virus outbreak.  4. Positive beta strep.   POSTOPERATIVE DIAGNOSES:  1. Intrauterine pregnancy at term.  2. Prodromal labor.  3. Active herpes simplex virus outbreak.   PROCEDURE:  Primary low transverse cervical cesarean section.   SURGEON:  Timothy P. Fontaine, M.D.   ASSISTANT:  Scrub technician.   ANESTHESIA:  Spinal.   ESTIMATED BLOOD LOSS:  500 cc.   COMPLICATIONS:  Samples of cord blood placenta.   FINDINGS:  At 27 normal female, Apgars 8 and 9, weight 7 pounds 4 ounces.  Pelvic anatomy noted to be normal.   DESCRIPTION OF PROCEDURE:  The patient was taken to the operating room and  underwent spinal anesthesia.  She was placed in the left tilt supine  position where she received an abdominal preparation with Betadine scrub and  Betadine solution.  The bladder was emptied with indwelling Foley catheters  placed in the sterile technique.  The patient was draped in the usual  fashion and after achieving adequate anesthesia, the abdomen was sharply  entered through a Pfannenstiel incision achieving adequate hemostasis at all  levels.  The bladder flap was sharply and bluntly developed without  difficulty, and the uterus was sharply entered in the lower uterine segment  and was bluntly extended laterally.  The bulging membranes ruptured and the  fluid noted to be clear.  The infant's head was delivered through the  incision with the assistance of the vacuum extractor.  The mouth  and nares  were suctioned.  The rest of the infant delivered.  The cord doubly clamped  and cut and the infant handed to pediatrics in attendance.  Samples of cord  blood were obtained.  The placenta was then spontaneously extruded, noted to  be intact and was sent to pathology.  The uterus was exteriorized, and the  endometrial cavity was explored with a sponge to remove all placental  membrane fragments.  The uterine incision was closed using 0 Vicryl suture  in a running interlocking stitch.  A single figure-of-eight suture was  placed in the middle of the incision to achieve ultimate hemostasis.  The  uterus was returned to the abdomen which was copiously irrigated showing  adequate hemostasis.  The anterior fascia was reapproximated using 0 Vicryl  in a running stitch.  Subcutaneous tissues were irrigated.  Adequate  hemostasis was achieved with electrocautery, and the skin was reapproximated  with  staples.  A sterile dressing was applied, and the patient was taken the  recovery room in good condition having tolerated the procedure well.  The  patient did receive 1 g Ancef antibiotic prophylaxis after cord clamping.                                               Timothy P. Audie Box, M.D.    TPF/MEDQ  D:  01/04/2002  T:  01/04/2002  Job:  [id number]

## 2010-08-08 NOTE — Op Note (Signed)
Theresa Mosley, Theresa Mosley              ACCOUNT NO.:  [id number]   MEDICAL RECORD NO.:  [id number]          PATIENT TYPE:  AMB   LOCATION:  [facility]                         FACILITY:  [facility]   PHYSICIAN:  Timothy P. Fontaine, M.D.DATE OF BIRTH:  20-May-1975   DATE OF PROCEDURE:  09/04/2005  DATE OF DISCHARGE:                                 OPERATIVE REPORT   PREOPERATIVE DIAGNOSIS:  Menorrhagia.   POSTOPERATIVE DIAGNOSIS:  Menorrhagia.   PROCEDURE:  Hysteroscopy, dilatation and curettage.   SURGEON:  Timothy P. Fontaine, M.D.   ANESTHETIC:  General.   ESTIMATED BLOOD LOSS:  Minimal.   SORBITOL DISCREPANCY:  Approximately 100 mL.   SPECIMEN:  Endometrial curettings.   COMPLICATIONS:  None.   FINDINGS:  External, BUS, vagina normal.  Cervix normal.  Bimanual:  Uterus  normal size, retroverted.  Adnexa without masses.  Hysteroscopic:  Thickened, undulating endometrial lining anterior endometrial cavity, no  distinct polyps or abnormalities visualized.  Hysteroscopy was adequate  noting fundus, anterior-posterior surfaces, lower uterine segment,  endocervical canal, right and left tubal ostia all visualized.   PROCEDURE:  The patient was taken to the operating room, underwent general  anesthesia, was placed in the low dorsal lithotomy position, received a  perineal-vaginal preparation with Betadine solution.  Bladder emptied with  in-and-out Foley catheterization.  EUA performed.  The patient was draped in  the usual fashion, cervix visualized with a speculum.  Anterior lip grasped  with a single-tooth tenaculum.  The cervix was gently gradually dilated to  admit the operative hysteroscope.  Hysteroscopy was performed with findings  noted above.  A sharp curettage was then performed with abundant return.  Re-  hysteroscopy showed good distension, empty cavity.  No evidence of  perforation.  The instruments were removed.  Adequate hemostasis visualized.  The speculum removed.  The  patient placed in the supine position, awakened  without difficulty and taken to the recovery room in good condition, having  tolerated the procedure well.     Timothy P. Fontaine, M.D.  Electronically Signed    TPF/MEDQ  D:  09/04/2005  T:  09/04/2005  Job:  [id number]

## 2011-04-07 ENCOUNTER — Encounter: Payer: Self-pay | Admitting: Gynecology

## 2011-04-07 ENCOUNTER — Ambulatory Visit (INDEPENDENT_AMBULATORY_CARE_PROVIDER_SITE_OTHER): Payer: 59 | Admitting: Gynecology

## 2011-04-07 ENCOUNTER — Telehealth: Payer: Self-pay | Admitting: *Deleted

## 2011-04-07 ENCOUNTER — Other Ambulatory Visit (HOSPITAL_COMMUNITY)
Admission: RE | Admit: 2011-04-07 | Discharge: 2011-04-07 | Disposition: A | Discharge status: Home / Self Care | Payer: 59 | Source: Ambulatory Visit | Attending: Gynecology | Admitting: Gynecology

## 2011-04-07 VITALS — BP 108/72 | Ht 64.0 in | Wt 137.0 lb

## 2011-04-07 DIAGNOSIS — Z131 Encounter for screening for diabetes mellitus: Secondary | ICD-10-CM

## 2011-04-07 DIAGNOSIS — Z13220 Encounter for screening for lipoid disorders: Secondary | ICD-10-CM

## 2011-04-07 DIAGNOSIS — N920 Excessive and frequent menstruation with regular cycle: Secondary | ICD-10-CM

## 2011-04-07 DIAGNOSIS — B009 Herpesviral infection, unspecified: Secondary | ICD-10-CM

## 2011-04-07 DIAGNOSIS — Z01419 Encounter for gynecological examination (general) (routine) without abnormal findings: Secondary | ICD-10-CM

## 2011-04-07 LAB — URINALYSIS W MICROSCOPIC + REFLEX CULTURE
Bilirubin Urine: NEGATIVE
Glucose, UA: NEGATIVE mg/dL
Hgb urine dipstick: NEGATIVE
Ketones, ur: NEGATIVE mg/dL
Leukocytes, UA: NEGATIVE
Nitrite: NEGATIVE
Protein, ur: NEGATIVE mg/dL
Specific Gravity, Urine: 1.005 (ref 1.005–1.030)
Urobilinogen, UA: 0.2 mg/dL (ref 0.0–1.0)
pH: 5.5 (ref 5.0–8.0)

## 2011-04-07 MED ORDER — VALACYCLOVIR HCL 500 MG PO TABS: 500.0000 mg | ORAL_TABLET | Freq: Two times a day (BID) | ORAL | Status: DC

## 2011-04-07 MED ORDER — NORETHINDRONE ACET-ETHINYL EST 1-20 MG-MCG PO TABS: 1.0000 | ORAL_TABLET | Freq: Every day | ORAL | Status: DC

## 2011-04-07 NOTE — Telephone Encounter (Signed)
Patient was informed benefits for Mirena IUD.  She would have to pay out of pocket for the whole amount.  Patient states she will discuss with TF other options for her at annual exam.

## 2011-04-07 NOTE — Progress Notes (Signed)
Addended by: Ladona Horns E on: 04/07/2011 04:53 PM   Modules accepted: Orders

## 2011-04-07 NOTE — Patient Instructions (Signed)
Start birth control pills as discussed for menstrual suppression. Follow up if heavy bleeding continues.

## 2011-04-07 NOTE — Progress Notes (Signed)
Addended by: Venora Maples on: 04/07/2011 03:28 PM   Modules accepted: Orders

## 2011-04-07 NOTE — Progress Notes (Signed)
Theresa Mosley 08/07/1975 [id number]        36 y.o.  for annual exam.  Several issues as noted below.  Past medical history,surgical history, medications, allergies, family history and social history were all reviewed and documented in the EPIC chart. ROS:  Was performed and pertinent positives and negatives are included in the history.  Exam: chaperone present Filed Vitals:   04/07/11 1426  BP: 108/72   General appearance  Normal Skin grossly normal Head/Neck normal with no cervical or supraclavicular adenopathy thyroid normal Lungs  clear Cardiac RR, without RMG Abdominal  soft, nontender, without masses, organomegaly or hernia Breasts  examined lying and sitting without masses, retractions, discharge or axillary adenopathy. Pelvic  Ext/BUS/vagina  normal   Cervix  normal  Pap done  Uterus  retroverted, normal size, shape and contour, midline and mobile nontender   Adnexa  Without masses or tenderness    Anus and perineum  normal   Rectovaginal  normal sphincter tone without palpated masses or tenderness.    Assessment/Plan:  36 y.o. female for annual exam.  Vasectomy birth control. 1. Menorrhagia. Patient's periods continue heavy. She had been on oral contraceptives but stopped them and her heavy periods resumed. She had been contemplating ablation or Mirena IUD but at this point it is too expensive on her insurance and she wants to restart oral contraceptives. She is using vasectomy for birth control. She had been evaluated in the past with normal hormone levels to include TSH FSH prolactin as well as a sonohysterogram which showed no intracavitary abnormalities, some suggestion of adenomyosis and a negative secratory phase endometrial biopsy.  I again reviewed the risks of oral contraceptives to include stroke heart attack DVT although she understands and accepts. She's otherwise healthy does not smoke I prescribed Loestrin 120 equivalent x1 year. 2. Pap smear. Patient does have  history of ASCUS with specimen insufficient to do HPV testing in 2007. She said to follow up normal Pap smears. I did a Pap smear today and discussed possible less frequent screening intervals assuming this is normal next year. 3. Breast health. SBE monthly reviewed. Screening mammogram recommendations between 35 and 40 were reviewed. She has no strong family history and prefers to wait closer to 40. 4. History of HSV. She has occasional HSV outbreaks. I refilled her Valtrex 500 mg twice a day x5 day prescription #30 with 3 refills. 5. Health maintenance. Baseline CBC, glucose, lipid profile, urinalysis were ordered. Assuming she has good results with the oral contraceptives and she will see me in a year, sooner as needed.    Dara Lords MD, 3:01 PM 04/07/2011

## 2011-04-08 ENCOUNTER — Other Ambulatory Visit: Payer: Self-pay

## 2011-04-08 DIAGNOSIS — N920 Excessive and frequent menstruation with regular cycle: Secondary | ICD-10-CM

## 2011-04-08 DIAGNOSIS — Z131 Encounter for screening for diabetes mellitus: Secondary | ICD-10-CM

## 2011-04-08 DIAGNOSIS — Z13220 Encounter for screening for lipoid disorders: Secondary | ICD-10-CM

## 2011-04-08 NOTE — Progress Notes (Signed)
PT. NOTIFIED TO RETURN FOR AEX LABS. PT. STATES HAD BLOODWORK DONE 2 WEEKS AGO WITH PCP AND WILL FAX IT TO DR. TF AND SEE IF SHE STILL NEEDS THE LABS HE ORDERED 04-07-11.

## 2012-04-07 ENCOUNTER — Encounter: Payer: 59 | Admitting: Gynecology

## 2012-04-15 ENCOUNTER — Other Ambulatory Visit: Payer: Self-pay | Admitting: Gynecology

## 2012-04-25 ENCOUNTER — Encounter: Payer: Self-pay | Admitting: Gynecology

## 2012-04-25 ENCOUNTER — Ambulatory Visit (INDEPENDENT_AMBULATORY_CARE_PROVIDER_SITE_OTHER): Payer: BC Managed Care – PPO | Admitting: Gynecology

## 2012-04-25 VITALS — BP 116/72 | Ht 64.0 in | Wt 141.0 lb

## 2012-04-25 DIAGNOSIS — Z23 Encounter for immunization: Secondary | ICD-10-CM

## 2012-04-25 DIAGNOSIS — E079 Disorder of thyroid, unspecified: Secondary | ICD-10-CM | POA: Insufficient documentation

## 2012-04-25 DIAGNOSIS — B001 Herpesviral vesicular dermatitis: Secondary | ICD-10-CM | POA: Insufficient documentation

## 2012-04-25 DIAGNOSIS — N898 Other specified noninflammatory disorders of vagina: Secondary | ICD-10-CM

## 2012-04-25 DIAGNOSIS — Z01419 Encounter for gynecological examination (general) (routine) without abnormal findings: Secondary | ICD-10-CM

## 2012-04-25 LAB — WET PREP FOR TRICH, YEAST, CLUE
Trich, Wet Prep: NONE SEEN
Yeast Wet Prep HPF POC: NONE SEEN

## 2012-04-25 MED ORDER — METRONIDAZOLE 500 MG PO TABS: 500.0000 mg | ORAL_TABLET | Freq: Two times a day (BID) | ORAL | Status: DC

## 2012-04-25 MED ORDER — NORETHINDRONE ACET-ETHINYL EST 1-20 MG-MCG PO TABS: 1.0000 | ORAL_TABLET | Freq: Every day | ORAL | Status: DC

## 2012-04-25 NOTE — Progress Notes (Signed)
JODEEN MCLIN [birth year] [id number]        37 y.o.  [id number] for annual exam.  Several issues noted below.  Past medical history,surgical history, medications, allergies, family history and social history were all reviewed and documented in the EPIC chart. ROS:  Was performed and pertinent positives and negatives are included in the history.  Exam: Kim assistant Filed Vitals:   04/25/12 1502  BP: 116/72  Height: 5\' 4"  (1.626 m)  Weight: 141 lb (63.957 kg)   General appearance  Normal Skin grossly normal Head/Neck normal with no cervical or supraclavicular adenopathy thyroid normal Lungs  clear Cardiac RR, without RMG Abdominal  soft, nontender, without masses, organomegaly or hernia Breasts  examined lying and sitting without masses, retractions, discharge or axillary adenopathy. Pelvic  Ext/BUS/vagina  normal excepting white discharge.  Cervix  normal   Uterus  anteverted, normal size, shape and contour, midline and mobile nontender   Adnexa  Without masses or tenderness    Anus and perineum  normal   Rectovaginal  normal sphincter tone without palpated masses or tenderness.    Assessment/Plan:  37 y.o. [id number] female for annual exam.   1. White discharge. Patient notes a slight discharge that comes and goes on questioning but did not volunteer this. She has no itching or odor. Wet prep consistent with a low level bacterial vaginosis. We'll treat with Flagyl 500 mg twice a day x7 days, alcohol avoidance reviewed. 2. History menorrhagia.. Patient on on low-dose oral contraceptives to manage her menses.  Had a negative sonohysterogram was some suggestive of adenomyosis. Biopsy should secretory endometrium. FSH TSH and prolactin were all normal.  Reviewed risks of oral contraceptives to include stroke heart attack DVT. She does not smoke nor being followed for diabetes or hypertension.  She is being followed for Raynaud's phenomenon The issue of Raynaud's whether this leads to  increased vascular risk with oral contraceptives discussed. She is contemplating Mirena IUD as we discussed in the past and she will follow up if she decides to pursue this. Otherwise I refilled her low-dose oral contraceptives times a year. 3. Pap smear 2013. No Pap smear done today. No history of significant abnormal Pap smears. Review current screening guidelines and will plan repeat at 3 year interval. 4. Breast health. Reviewed screening mammographic recommendations between 35 and 40. She has no strong family history and prefers to wait closer to 40. SBE monthly reviewed. 5. Health maintenance. No blood work done as it is all done through her other physician's office who sees her for her Raynaud's and also does her routine blood work. Follow up if she decides to pursue Mirena IUD, otherwise annually.   Dara Lords MD, 3:42 PM 04/25/2012

## 2012-04-25 NOTE — Patient Instructions (Addendum)
Take Flagyl antibiotic twice daily for 7 days, avoid alcohol while taking. Follow up for Mirena IUD if you choose. Follow up in one year for annual gynecologic exam. Follow up with your Raynaud's physician in reference to your hands.

## 2012-04-26 LAB — URINALYSIS W MICROSCOPIC + REFLEX CULTURE
Bacteria, UA: NONE SEEN
Bilirubin Urine: NEGATIVE
Casts: NONE SEEN
Crystals: NONE SEEN
Glucose, UA: NEGATIVE mg/dL
Hgb urine dipstick: NEGATIVE
Ketones, ur: NEGATIVE mg/dL
Leukocytes, UA: NEGATIVE
Nitrite: NEGATIVE
Protein, ur: NEGATIVE mg/dL
Specific Gravity, Urine: 1.011 (ref 1.005–1.030)
Squamous Epithelial / LPF: NONE SEEN
Urobilinogen, UA: 0.2 mg/dL (ref 0.0–1.0)
pH: 7 (ref 5.0–8.0)

## 2012-04-28 ENCOUNTER — Telehealth: Payer: Self-pay | Admitting: Gynecology

## 2012-04-28 ENCOUNTER — Encounter: Payer: Self-pay | Admitting: Gynecology

## 2012-04-28 ENCOUNTER — Other Ambulatory Visit: Payer: Self-pay | Admitting: Gynecology

## 2012-04-28 DIAGNOSIS — Z3049 Encounter for surveillance of other contraceptives: Secondary | ICD-10-CM

## 2012-04-28 MED ORDER — LEVONORGESTREL 20 MCG/24HR IU IUD: INTRAUTERINE_SYSTEM | Freq: Once | INTRAUTERINE | Status: DC

## 2012-04-28 NOTE — Telephone Encounter (Signed)
I LM on pt cell VM(ok per DPR) that the Mirena & insertion is covered at 100%,no copay. She is to call start of menses to schedule/WL

## 2012-05-04 ENCOUNTER — Ambulatory Visit (INDEPENDENT_AMBULATORY_CARE_PROVIDER_SITE_OTHER): Payer: BC Managed Care – PPO | Admitting: Gynecology

## 2012-05-04 ENCOUNTER — Encounter: Payer: Self-pay | Admitting: Gynecology

## 2012-05-04 DIAGNOSIS — Z30431 Encounter for routine checking of intrauterine contraceptive device: Secondary | ICD-10-CM

## 2012-05-04 MED ORDER — LEVONORGESTREL 20 MCG/24HR IU IUD: INTRAUTERINE_SYSTEM | Freq: Once | INTRAUTERINE | Status: DC

## 2012-05-04 NOTE — Progress Notes (Signed)
Patient presents for Mirena IUD placement. She has read through the booklet, has no contraindications and signed the consent form. She currently is on a normal menses.  I reviewed the insertional process with her as well as the risks to include infection either immediate or long-term, uterine perforation or migration requiring surgery to remove, other complications such as pain, hormonal side effects and possibilities of failure with subsequent pregnancy.    Exam with Kim assistant Pelvic: External BUS vagina normal. Cervix normal with menses flow. Uterus anteverted normal size shape contour midline mobile nontender. Adnexa without masses or tenderness.  Procedure: The cervix was cleansed with Betadine, anterior lip grasped with a single-tooth tenaculum, the uterus was sounded and a Mirena IUD was placed according to manufacturer's recommendations without difficulty. The strings were trimmed. The patient tolerated well and will follow up in one month for a postinsertional check.  Lot number: [id number]

## 2012-05-04 NOTE — Patient Instructions (Signed)
Intrauterine Device Insertion Most often, an intrauterine device (IUD) is inserted into the uterus to prevent pregnancy. There are 2 types of IUDs available:  Copper IUD. This type of IUD creates an environment that is not favorable to sperm survival. The mechanism of action of the copper IUD is not known for certain. It can stay in place for 10 years.  Hormone IUD. This type of IUD contains the hormone progestin (synthetic progesterone). The progestin thickens the cervical mucus and prevents sperm from entering the uterus, and it also thins the uterine lining. There is no evidence that the hormone IUD prevents implantation. The hormone IUD can stay in place for up to 5 years. An IUD is the most cost-effective birth control if left in place for the full duration. It may be removed at any time. LET YOUR CAREGIVER KNOW ABOUT:  Sensitivity to metals.  Medicines taken including herbs, eyedrops, over-the-counter medicines, and creams.  Use of steroids (by mouth or creams).  Previous problems with anesthetics or numbing medicine.  Previous gynecological surgery.  History of blood clots or clotting disorders.  Possibility of pregnancy.  Menstrual irregularities.  Concerns regarding unusual vaginal discharge or odors.  Previous experience with an IUD.  Other health problems. RISKS AND COMPLICATIONS  Accidental puncture (perforation) of the uterus.  Accidental placement of the IUD either in the muscle layer of the uterus (myometrium) or outside the uterus. If this happen, the IUD can be found essentially floating around the bowels. When this happens, the IUD must be taken out surgically.  The IUD may fall out of the uterus (expulsion). This is more common in women who have recently had a child.   Pregnancy in the fallopian tube (ectopic). BEFORE THE PROCEDURE  Schedule the IUD insertion for when you will have your menstrual period or right after, to make sure you are not pregnant.  Placement of the IUD is better tolerated shortly after a menstrual cycle.  You may need to take tests or be examined to make sure you are not pregnant.  You may be required to take a pregnancy test.  You may be required to get checked for sexually transmitted infections (STIs) prior to placement. Placing an IUD in someone who has an infection can make an infection worse.  You may be given a pain reliever to take 1 or 2 hours before the procedure.  An exam will be performed to determine the size and position of your uterus.  Ask your caregiver about changing or stopping your regular medicines. PROCEDURE   A tool (speculum) is placed in the vagina. This allows your caregiver to see the lower part of the uterus (cervix).  The cervix is prepped with a medicine that lowers the risk of infection.  You may be given a medicine to numb each side of the cervix (intracervical or paracervical block). This is used to block and control any discomfort with insertion.  A tool (uterine sound) is inserted into the uterus to determine the length of the uterine cavity and the direction the uterus may be tilted.  A slim instrument (IUD inserter) is inserted through the cervical canal and into your uterus.  The IUD is placed in the uterine cavity and the insertion device is removed.  The nylon string that is attached to the IUD, and used for eventual IUD removal, is trimmed. It is trimmed so that it lays high in the vagina, just outside the cervix. AFTER THE PROCEDURE  You may have bleeding after the  attached to the IUD, and used for eventual IUD removal, is trimmed. It is trimmed so that it lays high in the vagina, just outside the cervix.  AFTER THE PROCEDURE  · You may have bleeding after the procedure. This is normal. It varies from light spotting for a few days to menstrual-like bleeding.   · You may have mild cramping.  · Practice checking the string coming out of the cervix to make sure the IUD remains in the uterus. If you cannot feel the string, you should schedule a "string check" with your caregiver.  · If you had a hormone IUD inserted, expect that your period may be lighter or nonexistent  within a year's time (though this is not always the case). There may be delayed fertility with the hormone IUD as a result of its progesterone effect. When you are ready to become pregnant, it is suggested to have the IUD removed up to 1 year in advance.  · Yearly exams are advised.  Document Released: 11/05/2010 Document Revised: 06/01/2011 Document Reviewed: 11/05/2010  [employer]® Patient Information ©2013 [employer].

## 2012-05-09 ENCOUNTER — Other Ambulatory Visit: Payer: Self-pay | Admitting: Gynecology

## 2012-05-09 DIAGNOSIS — Z30431 Encounter for routine checking of intrauterine contraceptive device: Secondary | ICD-10-CM

## 2012-05-31 ENCOUNTER — Ambulatory Visit (INDEPENDENT_AMBULATORY_CARE_PROVIDER_SITE_OTHER): Payer: BC Managed Care – PPO | Admitting: Gynecology

## 2012-05-31 ENCOUNTER — Encounter: Payer: Self-pay | Admitting: Gynecology

## 2012-05-31 DIAGNOSIS — Z30431 Encounter for routine checking of intrauterine contraceptive device: Secondary | ICD-10-CM

## 2012-05-31 NOTE — Progress Notes (Signed)
Patient presents for IUD check. Had Mirena IUD placed last month and is doing well. Had intercourse without issues.  Exam with Kim assistant Pelvic external BUS vagina normal. Cervix normal with IUD string visualized an appropriate length. Uterus normal size, mobile nontender. Adnexa without masses or tenderness.  Assessment and plan: Normal IUD check. Followup in one year for annual exam. Sooner if any issues.

## 2012-05-31 NOTE — Patient Instructions (Addendum)
Followup in one year for annual exam. Sooner if any issues. 

## 2012-09-20 ENCOUNTER — Other Ambulatory Visit: Payer: Self-pay | Admitting: Gynecology

## 2013-04-26 ENCOUNTER — Ambulatory Visit (INDEPENDENT_AMBULATORY_CARE_PROVIDER_SITE_OTHER): Payer: 59 | Admitting: Gynecology

## 2013-04-26 ENCOUNTER — Encounter: Payer: Self-pay | Admitting: Gynecology

## 2013-04-26 VITALS — BP 120/76 | Ht 64.0 in | Wt 150.0 lb

## 2013-04-26 DIAGNOSIS — Z01419 Encounter for gynecological examination (general) (routine) without abnormal findings: Secondary | ICD-10-CM

## 2013-04-26 NOTE — Progress Notes (Signed)
Arlana LindauRebecca W Hojnacki 01/25/1976 [id number]        38 y.o.  [id number] for annual exam.  Several issues that are below.  Past medical history,surgical history, problem list, medications, allergies, family history and social history were all reviewed and documented in the EPIC chart.  ROS:  Performed and pertinent positives and negatives are included in the history, assessment and plan .  Exam: Kim assistant Filed Vitals:   04/26/13 1418  BP: 120/76  Height: 5\' 4"  (1.626 m)  Weight: 150 lb (68.04 kg)   General appearance  Normal Skin grossly normal Head/Neck normal with no cervical or supraclavicular adenopathy thyroid normal Lungs  clear Cardiac RR, without RMG Abdominal  soft, nontender, without masses, organomegaly or hernia Breasts  examined lying and sitting without masses, retractions, discharge or axillary adenopathy. Pelvic  Ext/BUS/vagina  Normal  Cervix  Normal with tight external os. IUD string not initially noted. Using the [facility] forceps to open the external os the IUD string was visualized within the cervical canal with the colposcope.  Uterus  axial, normal size, shape and contour, midline and mobile nontender   Adnexa  Without masses or tenderness    Anus and perineum  Normal   Rectovaginal  Normal sphincter tone without palpated masses or tenderness.    Assessment/Plan:  38 y.o. [id number] female for annual exam amenorrheic, Mirena IUD.   1. Patient is amenorrheic with her Mirena IUD doing well. String visualized within the endocervical canal. 2. Pap smear 03/2011. No Pap smear done today. No history of abnormal Pap smears previously. Repeat Pap smear next year 3 year interval. 3. Mammography later this year as she turns 38. SBE monthly reviewed. 4. Health maintenance. No blood work done and she reports this is all done through her primary physician's office who is following her for her hypothyroidism. Followup one year, sooner as needed.   Note: This document was prepared  with digital dictation and possible smart phrase technology. Any transcriptional errors that result from this process are unintentional.   Dara LordsFONTAINE,Ezma Rehm P MD, 2:51 PM 04/26/2013

## 2013-04-26 NOTE — Patient Instructions (Signed)
Followup in one year for annual exam, sooner as needed. 

## 2013-04-27 LAB — URINALYSIS W MICROSCOPIC + REFLEX CULTURE
Bacteria, UA: NONE SEEN
Bilirubin Urine: NEGATIVE
Casts: NONE SEEN
Crystals: NONE SEEN
Glucose, UA: NEGATIVE mg/dL
Ketones, ur: NEGATIVE mg/dL
Leukocytes, UA: NEGATIVE
Nitrite: NEGATIVE
Protein, ur: NEGATIVE mg/dL
Specific Gravity, Urine: 1.024 (ref 1.005–1.030)
Squamous Epithelial / LPF: NONE SEEN
Urobilinogen, UA: 0.2 mg/dL (ref 0.0–1.0)
pH: 6 (ref 5.0–8.0)

## 2014-01-22 ENCOUNTER — Encounter: Payer: Self-pay | Admitting: Gynecology

## 2014-11-29 ENCOUNTER — Other Ambulatory Visit (HOSPITAL_COMMUNITY)
Admission: RE | Admit: 2014-11-29 | Discharge: 2014-11-29 | Disposition: A | Discharge status: Home / Self Care | Payer: BLUE CROSS/BLUE SHIELD | Source: Ambulatory Visit | Attending: Gynecology | Admitting: Gynecology

## 2014-11-29 ENCOUNTER — Ambulatory Visit (INDEPENDENT_AMBULATORY_CARE_PROVIDER_SITE_OTHER): Payer: BLUE CROSS/BLUE SHIELD | Admitting: Gynecology

## 2014-11-29 ENCOUNTER — Encounter: Payer: Self-pay | Admitting: Gynecology

## 2014-11-29 VITALS — BP 130/80 | Ht 64.0 in | Wt 146.0 lb

## 2014-11-29 DIAGNOSIS — Z1151 Encounter for screening for human papillomavirus (HPV): Secondary | ICD-10-CM | POA: Diagnosis present

## 2014-11-29 DIAGNOSIS — Z23 Encounter for immunization: Secondary | ICD-10-CM

## 2014-11-29 DIAGNOSIS — R102 Pelvic and perineal pain: Secondary | ICD-10-CM | POA: Diagnosis not present

## 2014-11-29 DIAGNOSIS — Z01419 Encounter for gynecological examination (general) (routine) without abnormal findings: Secondary | ICD-10-CM | POA: Diagnosis not present

## 2014-11-29 NOTE — Patient Instructions (Signed)
Follow up for the ultrasound as scheduled.  Call to Schedule your mammogram  Facilities in [place]: 1)  [facility], [place] [address] Phone: [phone] 2)  [facility]. [address] [place] Phone: [phone] 3)  Dr. Isaiah Blakes at [facility] [address] Phone: [phone]     Mammogram A mammogram is an X-ray test to find changes in a woman's breast. You should get a mammogram if:  You are 51 years of age or older  You have risk factors.   Your doctor recommends that you have one.  BEFORE THE TEST  Do not schedule the test the week before your period, especially if your breasts are sore during this time.  On the day of your mammogram:  Wash your breasts and armpits well. After washing, do not put on any deodorant or talcum powder on until after your test.   Eat and drink as you usually do.   Take your medicines as usual.   If you are diabetic and take insulin, make sure you:   Eat before coming for your test.   Take your insulin as usual.   If you cannot keep your appointment, call before the appointment to cancel. Schedule another appointment.  TEST  You will need to undress from the waist up. You will put on a hospital gown.   Your breast will be put on the mammogram machine, and it will press firmly on your breast with a piece of plastic called a compression paddle. This will make your breast flatter so that the machine can X-ray all parts of your breast.   Both breasts will be X-rayed. Each breast will be X-rayed from above and from the side. An X-ray might need to be taken again if the picture is not good enough.   The mammogram will last about 15 to 30 minutes.  AFTER THE TEST Finding out the results of your test Ask when your test results will be ready. Make sure you get your test results.  Document Released: 06/05/2008 Document Revised: 02/26/2011  Document Reviewed: 06/05/2008 [facility] Patient Information 2012 [place].  You may obtain a copy of any labs that were done today by logging onto MyChart as outlined in the instructions provided with your AVS (after visit summary). The office will not call with normal lab results but certainly if there are any significant abnormalities then we will contact you.   Health Maintenance Adopting a healthy lifestyle and getting preventive care can go a long way to promote health and wellness. Talk with your health care provider about what schedule of regular examinations is right for you. This is a good chance for you to check in with your provider about disease prevention and staying healthy. In between checkups, there are plenty of things you can do on your own. Experts have done a lot of research about which lifestyle changes and preventive measures are most likely to keep you healthy. Ask your health care provider for more information. WEIGHT AND DIET  Eat a healthy diet  Be sure to include plenty of vegetables, fruits, low-fat dairy products, and lean protein.  Do not eat a lot of foods high in solid fats, added sugars, or salt.  Get regular exercise. This is one of the most important things you can do for your health.  Most adults should exercise for at least 150 minutes each week. The exercise should increase your  heart rate and make you sweat (moderate-intensity exercise).  Most adults should also do strengthening exercises at least twice a week. This is in addition to the moderate-intensity exercise.  Maintain a healthy weight  Body mass index (BMI) is a measurement that can be used to identify possible weight problems. It estimates body fat based on height and weight. Your health care provider can help determine your BMI and help you achieve or maintain a healthy weight.  For females 43 years of age and older:   A BMI below 18.5 is considered underweight.  A BMI of 18.5 to  24.9 is normal.  A BMI of 25 to 29.9 is considered overweight.  A BMI of 30 and above is considered obese.  Watch levels of cholesterol and blood lipids  You should start having your blood tested for lipids and cholesterol at 41 years of age, then have this test every 5 years.  You may need to have your cholesterol levels checked more often if:  Your lipid or cholesterol levels are high.  You are older than 41 years of age.  You are at high risk for heart disease.  CANCER SCREENING   Lung Cancer  Lung cancer screening is recommended for adults 59-46 years old who are at high risk for lung cancer because of a history of smoking.  A yearly low-dose CT scan of the lungs is recommended for people who:  Currently smoke.  Have quit within the past 15 years.  Have at least a 30-pack-year history of smoking. A pack year is smoking an average of one pack of cigarettes a day for 1 year.  Yearly screening should continue until it has been 15 years since you quit.  Yearly screening should stop if you develop a health problem that would prevent you from having lung cancer treatment.  Breast Cancer  Practice breast self-awareness. This means understanding how your breasts normally appear and feel.  It also means doing regular breast self-exams. Let your health care provider know about any changes, no matter how small.  If you are in your 20s or 30s, you should have a clinical breast exam (CBE) by a health care provider every 1-3 years as part of a regular health exam.  If you are 99 or older, have a CBE every year. Also consider having a breast X-ray (mammogram) every year.  If you have a family history of breast cancer, talk to your health care provider about genetic screening.  If you are at high risk for breast cancer, talk to your health care provider about having an MRI and a mammogram every year.  Breast cancer gene (BRCA) assessment is recommended for women who have family  members with BRCA-related cancers. BRCA-related cancers include:  Breast.  Ovarian.  Tubal.  Peritoneal cancers.  Results of the assessment will determine the need for genetic counseling and BRCA1 and BRCA2 testing. Cervical Cancer Routine pelvic examinations to screen for cervical cancer are no longer recommended for nonpregnant women who are considered low risk for cancer of the pelvic organs (ovaries, uterus, and vagina) and who do not have symptoms. A pelvic examination may be necessary if you have symptoms including those associated with pelvic infections. Ask your health care provider if a screening pelvic exam is right for you.   The Pap test is the screening test for cervical cancer for women who are considered at risk.  If you had a hysterectomy for a problem that was not cancer or a condition that  could lead to cancer, then you no longer need Pap tests.  If you are older than 65 years, and you have had normal Pap tests for the past 10 years, you no longer need to have Pap tests.  If you have had past treatment for cervical cancer or a condition that could lead to cancer, you need Pap tests and screening for cancer for at least 20 years after your treatment.  If you no longer get a Pap test, assess your risk factors if they change (such as having a new sexual partner). This can affect whether you should start being screened again.  Some women have medical problems that increase their chance of getting cervical cancer. If this is the case for you, your health care provider may recommend more frequent screening and Pap tests.  The human papillomavirus (HPV) test is another test that may be used for cervical cancer screening. The HPV test looks for the virus that can cause cell changes in the cervix. The cells collected during the Pap test can be tested for HPV.  The HPV test can be used to screen women 42 years of age and older. Getting tested for HPV can extend the interval  between normal Pap tests from three to five years.  An HPV test also should be used to screen women of any age who have unclear Pap test results.  After 41 years of age, women should have HPV testing as often as Pap tests.  Colorectal Cancer  This type of cancer can be detected and often prevented.  Routine colorectal cancer screening usually begins at 41 years of age and continues through 41 years of age.  Your health care provider may recommend screening at an earlier age if you have risk factors for colon cancer.  Your health care provider may also recommend using home test kits to check for hidden blood in the stool.  A small camera at the end of a tube can be used to examine your colon directly (sigmoidoscopy or colonoscopy). This is done to check for the earliest forms of colorectal cancer.  Routine screening usually begins at age 52.  Direct examination of the colon should be repeated every 5-10 years through 41 years of age. However, you may need to be screened more often if early forms of precancerous polyps or small growths are found. Skin Cancer  Check your skin from head to toe regularly.  Tell your health care provider about any new moles or changes in moles, especially if there is a change in a mole's shape or color.  Also tell your health care provider if you have a mole that is larger than the size of a pencil eraser.  Always use sunscreen. Apply sunscreen liberally and repeatedly throughout the day.  Protect yourself by wearing long sleeves, pants, a wide-brimmed hat, and sunglasses whenever you are outside. HEART DISEASE, DIABETES, AND HIGH BLOOD PRESSURE   Have your blood pressure checked at least every 1-2 years. High blood pressure causes heart disease and increases the risk of stroke.  If you are between 39 years and 94 years old, ask your health care provider if you should take aspirin to prevent strokes.  Have regular diabetes screenings. This involves  taking a blood sample to check your fasting blood sugar level.  If you are at a normal weight and have a low risk for diabetes, have this test once every three years after 41 years of age.  If you are overweight and have  a high risk for diabetes, consider being tested at a younger age or more often. PREVENTING INFECTION  Hepatitis B  If you have a higher risk for hepatitis B, you should be screened for this virus. You are considered at high risk for hepatitis B if:  You were born in a country where hepatitis B is common. Ask your health care provider which countries are considered high risk.  Your parents were born in a high-risk country, and you have not been immunized against hepatitis B (hepatitis B vaccine).  You have HIV or AIDS.  You use needles to inject street drugs.  You live with someone who has hepatitis B.  You have had sex with someone who has hepatitis B.  You get hemodialysis treatment.  You take certain medicines for conditions, including cancer, organ transplantation, and autoimmune conditions. Hepatitis C  Blood testing is recommended for:  Everyone born from [birth year] through 1967.  Anyone with known risk factors for hepatitis C. Sexually transmitted infections (STIs)  You should be screened for sexually transmitted infections (STIs) including gonorrhea and chlamydia if:  You are sexually active and are younger than 41 years of age.  You are older than 41 years of age and your health care provider tells you that you are at risk for this type of infection.  Your sexual activity has changed since you were last screened and you are at an increased risk for chlamydia or gonorrhea. Ask your health care provider if you are at risk.  If you do not have HIV, but are at risk, it may be recommended that you take a prescription medicine daily to prevent HIV infection. This is called pre-exposure prophylaxis (PrEP). You are considered at risk if:  You are sexually active  and do not regularly use condoms or know the HIV status of your partner(s).  You take drugs by injection.  You are sexually active with a partner who has HIV. Talk with your health care provider about whether you are at high risk of being infected with HIV. If you choose to begin PrEP, you should first be tested for HIV. You should then be tested every 3 months for as long as you are taking PrEP.  PREGNANCY   If you are premenopausal and you may become pregnant, ask your health care provider about preconception counseling.  If you may become pregnant, take 400 to 800 micrograms (mcg) of folic acid every day.  If you want to prevent pregnancy, talk to your health care provider about birth control (contraception). OSTEOPOROSIS AND MENOPAUSE   Osteoporosis is a disease in which the bones lose minerals and strength with aging. This can result in serious bone fractures. Your risk for osteoporosis can be identified using a bone density scan.  If you are 77 years of age or older, or if you are at risk for osteoporosis and fractures, ask your health care provider if you should be screened.  Ask your health care provider whether you should take a calcium or vitamin D supplement to lower your risk for osteoporosis.  Menopause may have certain physical symptoms and risks.  Hormone replacement therapy may reduce some of these symptoms and risks. Talk to your health care provider about whether hormone replacement therapy is right for you.  HOME CARE INSTRUCTIONS   Schedule regular health, dental, and eye exams.  Stay current with your immunizations.   Do not use any tobacco products including cigarettes, chewing tobacco, or electronic cigarettes.  If you are pregnant,  do not drink alcohol.  If you are breastfeeding, limit how much and how often you drink alcohol.  Limit alcohol intake to no more than 1 drink per day for nonpregnant women. One drink equals 12 ounces of beer, 5 ounces of wine,  or 1 ounces of hard liquor.  Do not use street drugs.  Do not share needles.  Ask your health care provider for help if you need support or information about quitting drugs.  Tell your health care provider if you often feel depressed.  Tell your health care provider if you have ever been abused or do not feel safe at home. Document Released: 09/22/2010 Document Revised: 07/24/2013 Document Reviewed: 02/08/2013 [facility] Patient Information 2015 [place], [place]. This information is not intended to replace advice given to you by your health care provider. Make sure you discuss any questions you have with your health care provider.

## 2014-11-29 NOTE — Progress Notes (Signed)
RICKY DOAN Aug 04, 1975 [id number]        39 y.o.  [id number] for annual exam.  Several issues noted below.  Past medical history,surgical history, problem list, medications, allergies, family history and social history were all reviewed and documented as reviewed in the EPIC chart.  ROS:  Performed with pertinent positives and negatives included in the history, assessment and plan.   Additional significant findings :  none   Exam: Delena Serve Vitals:   11/29/14 1104  BP: 130/80  Height:  (1.626 m)  Weight: 146 lb (66.225 kg)   General appearance:  Normal affect, orientation and appearance. Skin: Grossly normal HEENT: Without gross lesions.  No cervical or supraclavicular adenopathy. Thyroid normal.  Lungs:  Clear without wheezing, rales or rhonchi Cardiac: RR, without RMG Abdominal:  Soft, nontender, without masses, guarding, rebound, organomegaly or hernia Breasts:  Examined lying and sitting without masses, retractions, discharge or axillary adenopathy. Pelvic:  Ext/BUS/vagina normal  Cervix normal. IUD string not visualized  Uterus retroverted, normal size, shape and contour, midline and mobile nontender   Adnexa  Without masses or tenderness    Anus and perineum  Normal   Rectovaginal  Normal sphincter tone without palpated masses or tenderness.    Assessment/Plan:  39 y.o. [id number] female for annual exam without menses, Mirena IUD.   1. Mirena IUD 04/2012. Without menses. IUD string not visualized as in the past. Will follow up for ultrasound per #2. 2. Intermittent pelvic discomfort comes and goes. Feels like uterine cramping. Also with consistent deep dyspareunia. Been going on for approximately 1 year. Without GI issues such as diarrhea or constipation. No urinary symptoms such as frequency dysuria or urgency. Schedule ultrasound for IUD placement/rule out ovarian disease. Check baseline urinalysis. 3. Mammography never.  Recommended she schedule a  baseline mammogram she agrees to do so. SBE monthly reviewed. 4. Pap smear 2013.  Pap smear/HPV today.  History of ASCUS 2007 with normal Pap smears afterwards. 5. Health maintenance. No routine blood work done as she reports it done at her primary physician's office. Follow up 1 year, sooner as needed.   Dara Lords MD, 11:43 AM 11/29/2014

## 2014-11-29 NOTE — Addendum Note (Signed)
Addended by: Kem Parkinson on: 11/29/2014 12:04 PM   Modules accepted: Orders

## 2014-11-30 LAB — URINALYSIS W MICROSCOPIC + REFLEX CULTURE
Bacteria, UA: NONE SEEN [HPF]
Bilirubin Urine: NEGATIVE
Casts: NONE SEEN [LPF]
Crystals: NONE SEEN [HPF]
Glucose, UA: NEGATIVE
Hgb urine dipstick: NEGATIVE
Ketones, ur: NEGATIVE
Leukocytes, UA: NEGATIVE
Nitrite: NEGATIVE
Protein, ur: NEGATIVE
RBC / HPF: NONE SEEN RBC/HPF (ref <=2)
Specific Gravity, Urine: 1.011 (ref 1.001–1.035)
Squamous Epithelial / LPF: NONE SEEN [HPF] (ref <=5)
WBC, UA: NONE SEEN WBC/HPF (ref <=5)
Yeast: NONE SEEN [HPF]
pH: 6 (ref 5.0–8.0)

## 2014-12-03 LAB — CYTOLOGY - PAP

## 2014-12-12 ENCOUNTER — Ambulatory Visit (INDEPENDENT_AMBULATORY_CARE_PROVIDER_SITE_OTHER): Payer: BLUE CROSS/BLUE SHIELD | Admitting: Gynecology

## 2014-12-12 ENCOUNTER — Ambulatory Visit (INDEPENDENT_AMBULATORY_CARE_PROVIDER_SITE_OTHER): Payer: BLUE CROSS/BLUE SHIELD

## 2014-12-12 ENCOUNTER — Encounter: Payer: Self-pay | Admitting: Gynecology

## 2014-12-12 ENCOUNTER — Other Ambulatory Visit: Payer: Self-pay | Admitting: Gynecology

## 2014-12-12 VITALS — BP 120/76

## 2014-12-12 DIAGNOSIS — R102 Pelvic and perineal pain: Secondary | ICD-10-CM

## 2014-12-12 DIAGNOSIS — Z30431 Encounter for routine checking of intrauterine contraceptive device: Secondary | ICD-10-CM | POA: Diagnosis not present

## 2014-12-12 DIAGNOSIS — N949 Unspecified condition associated with female genital organs and menstrual cycle: Secondary | ICD-10-CM | POA: Diagnosis not present

## 2014-12-12 NOTE — Progress Notes (Signed)
Theresa Mosley [birth year] [id number]        39 y.o.  [id number] presents for follow up ultrasound. Has IUD were we did not visualize the string and she has year-long history of intermittent fleeting cramping type pain.  Past medical history,surgical history, problem list, medications, allergies, family history and social history were all reviewed and documented in the EPIC chart.  Directed ROS with pertinent positives and negatives documented in the history of present illness/assessment and plan.  Exam: Filed Vitals:   12/12/14 0959  BP: 120/76   General appearance:  Normal  Ultrasound shows uterus normal size and echotexture. Endometrial echo 2.7 mm. IUD within the normal position. Right and left ovaries grossly normal. Cul-de-sac negative. There is an area of calcification lateral to the right uterine wall. Questionable subserosal calcified myoma, possible GI contaminant or ureteral lithiasis.  Assessment/Plan:  39 y.o. [id number] with ultrasound normal from a GYN standpoint. Questionable right pelvic calcification whether a calcified leiomyoma versus stone in the ureter or possible GI content. She's not having significant ureteral type pain in her recent urinalysis was negative for blood. At this point recommend follow up ultrasound in one month to 2 months. If persists may consider stone CT protocol. Discussed all this with the patient and she agrees will follow up in 1-2 months.    Dara Lords MD, 10:11 AM 12/12/2014

## 2014-12-12 NOTE — Patient Instructions (Signed)
Follow up for ultrasound in 1-2 months

## 2015-01-30 ENCOUNTER — Other Ambulatory Visit: Payer: Self-pay | Admitting: Gynecology

## 2015-01-30 ENCOUNTER — Ambulatory Visit (INDEPENDENT_AMBULATORY_CARE_PROVIDER_SITE_OTHER): Payer: BLUE CROSS/BLUE SHIELD | Admitting: Gynecology

## 2015-01-30 ENCOUNTER — Encounter: Payer: Self-pay | Admitting: Gynecology

## 2015-01-30 ENCOUNTER — Ambulatory Visit (INDEPENDENT_AMBULATORY_CARE_PROVIDER_SITE_OTHER): Payer: BLUE CROSS/BLUE SHIELD

## 2015-01-30 VITALS — BP 120/70

## 2015-01-30 DIAGNOSIS — R198 Other specified symptoms and signs involving the digestive system and abdomen: Secondary | ICD-10-CM

## 2015-01-30 DIAGNOSIS — R102 Pelvic and perineal pain unspecified side: Secondary | ICD-10-CM

## 2015-01-30 DIAGNOSIS — N949 Unspecified condition associated with female genital organs and menstrual cycle: Secondary | ICD-10-CM | POA: Diagnosis not present

## 2015-01-30 DIAGNOSIS — N9489 Other specified conditions associated with female genital organs and menstrual cycle: Secondary | ICD-10-CM

## 2015-01-30 DIAGNOSIS — IMO0002 Reserved for concepts with insufficient information to code with codable children: Secondary | ICD-10-CM

## 2015-01-30 NOTE — Progress Notes (Signed)
Theresa LindauRebecca W Kelnhofer 12/19/1975 [id number]        39 y.o.  [id number] presents for follow up ultrasound with history of right adnexal calcification 9 mm discovered at her 12/12/2014 visit. Notes she is overall doing well no pelvic pain or other discomfort. No blood in the urine.  Past medical history,surgical history, problem list, medications, allergies, family history and social history were all reviewed and documented in the EPIC chart.  Directed ROS with pertinent positives and negatives documented in the history of present illness/assessment and plan.  Exam: Filed Vitals:   01/30/15 1129  BP: 120/70   General appearance:  Normal  Ultrasound shows uterus normal size and echotexture. IUD located within the endometrial cavity normal position. Right and left ovaries normal. Cul-de-sac negative. Continued presence of the right adnexal calcification between ovary and uterus 9 x 6 x 9 mm.  Assessment/Plan:  39 y.o. [id number] with persistent right adnexal calcification 9 mm. Differential reviewed with the patient to include urinary, GI, GYN. She's having no discomfort or symptoms. Follow up options to include further studies such as MRI/CT scans versus observation discussed. Given its proximity to the uterus I doubt urinary related. Will check urinalysis again today. Patient's comfortable with doing nothing at this point and I did recommend repeating her ultrasound in 6 months just to follow up on this and she agrees with this.    Dara LordsFONTAINE,Andilynn Delavega P MD, 11:41 AM 01/30/2015

## 2015-01-30 NOTE — Patient Instructions (Signed)
Follow up for ultrasound in 6 months 

## 2015-01-31 LAB — URINALYSIS W MICROSCOPIC + REFLEX CULTURE

## 2015-07-17 DIAGNOSIS — Z0000 Encounter for general adult medical examination without abnormal findings: Secondary | ICD-10-CM | POA: Diagnosis not present

## 2015-07-17 DIAGNOSIS — E039 Hypothyroidism, unspecified: Secondary | ICD-10-CM | POA: Diagnosis not present

## 2015-07-24 DIAGNOSIS — Z0000 Encounter for general adult medical examination without abnormal findings: Secondary | ICD-10-CM | POA: Diagnosis not present

## 2015-07-24 DIAGNOSIS — Z1389 Encounter for screening for other disorder: Secondary | ICD-10-CM | POA: Diagnosis not present

## 2015-07-24 DIAGNOSIS — E038 Other specified hypothyroidism: Secondary | ICD-10-CM | POA: Diagnosis not present

## 2015-07-24 DIAGNOSIS — I7300 Raynaud's syndrome without gangrene: Secondary | ICD-10-CM | POA: Diagnosis not present

## 2015-07-24 DIAGNOSIS — G4709 Other insomnia: Secondary | ICD-10-CM | POA: Diagnosis not present

## 2015-07-24 DIAGNOSIS — J302 Other seasonal allergic rhinitis: Secondary | ICD-10-CM | POA: Diagnosis not present

## 2015-07-25 ENCOUNTER — Other Ambulatory Visit: Payer: Self-pay | Admitting: Gynecology

## 2015-07-25 DIAGNOSIS — N9489 Other specified conditions associated with female genital organs and menstrual cycle: Secondary | ICD-10-CM

## 2015-07-31 ENCOUNTER — Encounter: Payer: Self-pay | Admitting: Gynecology

## 2015-07-31 ENCOUNTER — Ambulatory Visit (INDEPENDENT_AMBULATORY_CARE_PROVIDER_SITE_OTHER): Payer: BLUE CROSS/BLUE SHIELD

## 2015-07-31 ENCOUNTER — Other Ambulatory Visit: Payer: Self-pay | Admitting: Gynecology

## 2015-07-31 ENCOUNTER — Ambulatory Visit (INDEPENDENT_AMBULATORY_CARE_PROVIDER_SITE_OTHER): Payer: BLUE CROSS/BLUE SHIELD | Admitting: Gynecology

## 2015-07-31 VITALS — BP 120/70

## 2015-07-31 DIAGNOSIS — N9489 Other specified conditions associated with female genital organs and menstrual cycle: Secondary | ICD-10-CM

## 2015-07-31 DIAGNOSIS — R198 Other specified symptoms and signs involving the digestive system and abdomen: Secondary | ICD-10-CM

## 2015-07-31 DIAGNOSIS — N8311 Corpus luteum cyst of right ovary: Secondary | ICD-10-CM | POA: Diagnosis not present

## 2015-07-31 DIAGNOSIS — IMO0002 Reserved for concepts with insufficient information to code with codable children: Secondary | ICD-10-CM

## 2015-07-31 DIAGNOSIS — Z91018 Allergy to other foods: Secondary | ICD-10-CM | POA: Diagnosis not present

## 2015-07-31 DIAGNOSIS — J3081 Allergic rhinitis due to animal (cat) (dog) hair and dander: Secondary | ICD-10-CM | POA: Diagnosis not present

## 2015-07-31 DIAGNOSIS — N949 Unspecified condition associated with female genital organs and menstrual cycle: Secondary | ICD-10-CM | POA: Diagnosis not present

## 2015-07-31 DIAGNOSIS — H1045 Other chronic allergic conjunctivitis: Secondary | ICD-10-CM | POA: Diagnosis not present

## 2015-07-31 DIAGNOSIS — J3089 Other allergic rhinitis: Secondary | ICD-10-CM | POA: Diagnosis not present

## 2015-07-31 NOTE — Patient Instructions (Signed)
Follow up in September 2017 for annual exam, sooner if any issues 

## 2015-07-31 NOTE — Progress Notes (Signed)
    Theresa Mosley 03/23/1975 [id number]        40 y.o.  [id number] Presents for follow up ultrasound with history of incidental finding of a right pelvic calcification between uterus and ovaries. Initially noted 11/2014 on ultrasound for pelvic cramping. Follow up ultrasound 01/2015 the small calcified area remains unchanged. She is no longer having discomfort. Presents now for ultrasound to relook at this area.  Past medical history,surgical history, problem list, medications, allergies, family history and social history were all reviewed and documented in the EPIC chart.  Directed ROS with pertinent positives and negatives documented in the history of present illness/assessment and plan.  Exam:  Filed Vitals:   07/31/15 1129  BP: 120/70   General appearance:  Normal  Ultrasound shows uterus normal size and echotexture. IUD noted within the cavity in proper location. Endometrial echo 5.0 mm. Left ovary normal. Right ovary with 28 x 20 x 24 mm thick-walled cyst consistent with corpus luteum. Small calcification 9 x 7 x 10 mm between ovary and uterus, unchanged from prior exam. Cul-de-sac with mild fluid at 34 x 9 mm.  Assessment/Plan:  40 y.o. [id number] with persistent unchanged small pelvic calcification first noted 11/2014. I again reviewed differential to include GYN versus non-GYN. No associated mass or other pathology. Reviewed options with her. I do not think surgery would be appropriate as this probably is a physiologic calcification and would be extremely difficult to find potentially with laparoscopy. As it has remained unchanged at approximately 8 months unlikely related to tumor or other pathology. Ultimately we both agree with continued expectant management and no further screening unless symptoms present.  Greater than 50% of my time was spent in direct face to face counseling and coordination of care with the patient.     Dara LordsFONTAINE,Jolisa Intriago P MD, 11:45 AM 07/31/2015

## 2015-12-12 DIAGNOSIS — Z23 Encounter for immunization: Secondary | ICD-10-CM | POA: Diagnosis not present

## 2016-04-23 DIAGNOSIS — H524 Presbyopia: Secondary | ICD-10-CM | POA: Diagnosis not present

## 2016-04-23 DIAGNOSIS — H5213 Myopia, bilateral: Secondary | ICD-10-CM | POA: Diagnosis not present

## 2016-07-22 DIAGNOSIS — E038 Other specified hypothyroidism: Secondary | ICD-10-CM | POA: Diagnosis not present

## 2016-07-22 DIAGNOSIS — Z0000 Encounter for general adult medical examination without abnormal findings: Secondary | ICD-10-CM | POA: Diagnosis not present

## 2016-07-29 DIAGNOSIS — Z0000 Encounter for general adult medical examination without abnormal findings: Secondary | ICD-10-CM | POA: Diagnosis not present

## 2016-07-29 DIAGNOSIS — M2550 Pain in unspecified joint: Secondary | ICD-10-CM | POA: Diagnosis not present

## 2016-07-29 DIAGNOSIS — I7300 Raynaud's syndrome without gangrene: Secondary | ICD-10-CM | POA: Diagnosis not present

## 2016-07-29 DIAGNOSIS — J302 Other seasonal allergic rhinitis: Secondary | ICD-10-CM | POA: Diagnosis not present

## 2016-07-29 DIAGNOSIS — E038 Other specified hypothyroidism: Secondary | ICD-10-CM | POA: Diagnosis not present

## 2016-07-29 DIAGNOSIS — Z1389 Encounter for screening for other disorder: Secondary | ICD-10-CM | POA: Diagnosis not present

## 2016-07-30 DIAGNOSIS — Z91018 Allergy to other foods: Secondary | ICD-10-CM | POA: Diagnosis not present

## 2016-07-30 DIAGNOSIS — J3089 Other allergic rhinitis: Secondary | ICD-10-CM | POA: Diagnosis not present

## 2016-07-30 DIAGNOSIS — J3081 Allergic rhinitis due to animal (cat) (dog) hair and dander: Secondary | ICD-10-CM | POA: Diagnosis not present

## 2016-07-30 DIAGNOSIS — H1045 Other chronic allergic conjunctivitis: Secondary | ICD-10-CM | POA: Diagnosis not present

## 2016-10-13 DIAGNOSIS — M25561 Pain in right knee: Secondary | ICD-10-CM | POA: Diagnosis not present

## 2017-07-29 DIAGNOSIS — J3089 Other allergic rhinitis: Secondary | ICD-10-CM | POA: Diagnosis not present

## 2017-07-29 DIAGNOSIS — Z91018 Allergy to other foods: Secondary | ICD-10-CM | POA: Diagnosis not present

## 2017-07-29 DIAGNOSIS — H1045 Other chronic allergic conjunctivitis: Secondary | ICD-10-CM | POA: Diagnosis not present

## 2017-07-29 DIAGNOSIS — J3081 Allergic rhinitis due to animal (cat) (dog) hair and dander: Secondary | ICD-10-CM | POA: Diagnosis not present

## 2017-08-13 DIAGNOSIS — E038 Other specified hypothyroidism: Secondary | ICD-10-CM | POA: Diagnosis not present

## 2017-08-13 DIAGNOSIS — Z0000 Encounter for general adult medical examination without abnormal findings: Secondary | ICD-10-CM | POA: Diagnosis not present

## 2017-08-19 DIAGNOSIS — Z0000 Encounter for general adult medical examination without abnormal findings: Secondary | ICD-10-CM | POA: Diagnosis not present

## 2017-08-19 DIAGNOSIS — E038 Other specified hypothyroidism: Secondary | ICD-10-CM | POA: Diagnosis not present

## 2017-08-19 DIAGNOSIS — I7300 Raynaud's syndrome without gangrene: Secondary | ICD-10-CM | POA: Diagnosis not present

## 2017-08-19 DIAGNOSIS — Z1389 Encounter for screening for other disorder: Secondary | ICD-10-CM | POA: Diagnosis not present

## 2017-08-19 DIAGNOSIS — J302 Other seasonal allergic rhinitis: Secondary | ICD-10-CM | POA: Diagnosis not present

## 2017-08-19 DIAGNOSIS — G4709 Other insomnia: Secondary | ICD-10-CM | POA: Diagnosis not present

## 2017-08-24 DIAGNOSIS — Z1212 Encounter for screening for malignant neoplasm of rectum: Secondary | ICD-10-CM | POA: Diagnosis not present

## 2017-10-05 ENCOUNTER — Encounter: Payer: BLUE CROSS/BLUE SHIELD | Admitting: Gynecology

## 2017-10-22 ENCOUNTER — Encounter: Payer: Self-pay | Admitting: Gynecology

## 2017-10-22 ENCOUNTER — Ambulatory Visit: Payer: BLUE CROSS/BLUE SHIELD | Admitting: Gynecology

## 2017-10-22 VITALS — BP 126/80 | Ht 63.5 in | Wt 157.0 lb

## 2017-10-22 DIAGNOSIS — Z01419 Encounter for gynecological examination (general) (routine) without abnormal findings: Secondary | ICD-10-CM

## 2017-10-22 DIAGNOSIS — Z30431 Encounter for routine checking of intrauterine contraceptive device: Secondary | ICD-10-CM

## 2017-10-22 NOTE — Progress Notes (Signed)
    Theresa Mosley 12/06/1975 [id number]        42 y.o.  [id number] for annual gynecologic exam.  Doing well without gynecologic complaints.  Several issues noted below.  Past medical history,surgical history, problem list, medications, allergies, family history and social history were all reviewed and documented as reviewed in the EPIC chart.  ROS:  Performed with pertinent positives and negatives included in the history, assessment and plan.   Additional significant findings : None   Exam: Biomedical scientistBlanca assistant Vitals:   10/22/17 1607  BP: 126/80  Weight: 157 lb (71.2 kg)  Height: 5' 3.5" (1.613 m)   Body mass index is 27.38 kg/m.  General appearance:  Normal affect, orientation and appearance. Skin: Grossly normal HEENT: Without gross lesions.  No cervical or supraclavicular adenopathy. Thyroid normal.  Lungs:  Clear without wheezing, rales or rhonchi Cardiac: RR, without RMG Abdominal:  Soft, nontender, without masses, guarding, rebound, organomegaly or hernia Breasts:  Examined lying and sitting without masses, retractions, discharge or axillary adenopathy. Pelvic:  Ext, BUS, Vagina: Normal  Cervix: Normal IUD string not visualized  Uterus: Anteverted, normal size, shape and contour, midline and mobile nontender   Adnexa: Without masses or tenderness    Anus and perineum: Normal   Rectovaginal: Normal sphincter tone without palpated masses or tenderness.    Assessment/Plan:  42 y.o. [id number] female for annual gynecologic exam without menses, Mirena IUD.   1. Mirena IUD 04/2012.  Husband has vasectomy.  Using Mirena IUD for menstrual suppression.  Not having hot flushes night sweats or other menopausal symptoms.  Options to remove IUD now monitor cycles will go from there versus leaving in another year to his long as she continues with menstrual suppression discussed as she does not use it for contraception.  Patient would prefer to leave it in place and will rediscuss this issue  again next year. 2. Mammography never.  I again stressed the need to schedule a baseline mammogram.  Most common cancer in women reviewed.  Patient agrees to schedule this coming year.  Breast exam normal today. 3. Pap smear/HPV 11/2014.  No Pap smear done today.  No history of significant abnormal Pap smears.  Plan repeat Pap smear/HPV at 5-year interval per current screening guidelines. 4. Health maintenance.  No routine lab work done as patient does this elsewhere.  Follow-up 1 year, sooner as needed.   Dara Lordsimothy P Lucciana Head MD, 4:34 PM 10/22/2017

## 2017-10-22 NOTE — Patient Instructions (Signed)
Call to Schedule your mammogram  Facilities in [place]: 1)  [facility]. [facility], [address] Phone: [phone] 2)  Dr. Bertrand at [facility]  [address] Phone: [phone]     Mammogram A mammogram is an X-ray test to find changes in a woman's breast. You should get a mammogram if:  You are 44 years of age or older  You have risk factors.   Your doctor recommends that you have one.  BEFORE THE TEST  Do not schedule the test the week before your period, especially if your breasts are sore during this time.  On the day of your mammogram:  Wash your breasts and armpits well. After washing, do not put on any deodorant or talcum powder on until after your test.   Eat and drink as you usually do.   Take your medicines as usual.   If you are diabetic and take insulin, make sure you:   Eat before coming for your test.   Take your insulin as usual.   If you cannot keep your appointment, call before the appointment to cancel. Schedule another appointment.  TEST  You will need to undress from the waist up. You will put on a hospital gown.   Your breast will be put on the mammogram machine, and it will press firmly on your breast with a piece of plastic called a compression paddle. This will make your breast flatter so that the machine can X-ray all parts of your breast.   Both breasts will be X-rayed. Each breast will be X-rayed from above and from the side. An X-ray might need to be taken again if the picture is not good enough.   The mammogram will last about 15 to 30 minutes.  AFTER THE TEST Finding out the results of your test Ask when your test results will be ready. Make sure you get your test results.  Document Released: 06/05/2008 Document Revised: 02/26/2011 Document Reviewed: 06/05/2008 ExitCare Patient Information 2012 [employer].   

## 2018-04-16 DIAGNOSIS — Z23 Encounter for immunization: Secondary | ICD-10-CM | POA: Diagnosis not present

## 2018-10-24 ENCOUNTER — Encounter: Payer: BLUE CROSS/BLUE SHIELD | Admitting: Gynecology

## 2018-12-13 ENCOUNTER — Encounter: Payer: Self-pay | Admitting: Gynecology

## 2020-03-04 DIAGNOSIS — H5213 Myopia, bilateral: Secondary | ICD-10-CM | POA: Diagnosis not present

## 2020-03-04 DIAGNOSIS — H524 Presbyopia: Secondary | ICD-10-CM | POA: Diagnosis not present

## 2020-06-27 ENCOUNTER — Other Ambulatory Visit: Payer: Self-pay | Admitting: *Deleted

## 2020-06-27 ENCOUNTER — Other Ambulatory Visit (HOSPITAL_COMMUNITY)
Admission: RE | Admit: 2020-06-27 | Discharge: 2020-06-27 | Disposition: A | Discharge status: Home / Self Care | Payer: BLUE CROSS/BLUE SHIELD | Source: Ambulatory Visit | Attending: Nurse Practitioner | Admitting: Nurse Practitioner

## 2020-06-27 ENCOUNTER — Encounter: Payer: Self-pay | Admitting: Nurse Practitioner

## 2020-06-27 ENCOUNTER — Other Ambulatory Visit: Payer: Self-pay

## 2020-06-27 ENCOUNTER — Ambulatory Visit (INDEPENDENT_AMBULATORY_CARE_PROVIDER_SITE_OTHER): Payer: BC Managed Care – PPO | Admitting: Nurse Practitioner

## 2020-06-27 VITALS — BP 120/76 | Ht 64.5 in | Wt 157.0 lb

## 2020-06-27 DIAGNOSIS — Z975 Presence of (intrauterine) contraceptive device: Secondary | ICD-10-CM

## 2020-06-27 DIAGNOSIS — N912 Amenorrhea, unspecified: Secondary | ICD-10-CM

## 2020-06-27 DIAGNOSIS — Z538 Procedure and treatment not carried out for other reasons: Secondary | ICD-10-CM | POA: Diagnosis not present

## 2020-06-27 DIAGNOSIS — Z01419 Encounter for gynecological examination (general) (routine) without abnormal findings: Secondary | ICD-10-CM | POA: Diagnosis not present

## 2020-06-27 DIAGNOSIS — Z30432 Encounter for removal of intrauterine contraceptive device: Secondary | ICD-10-CM

## 2020-06-27 DIAGNOSIS — N941 Unspecified dyspareunia: Secondary | ICD-10-CM | POA: Diagnosis not present

## 2020-06-27 DIAGNOSIS — T8332XD Displacement of intrauterine contraceptive device, subsequent encounter: Secondary | ICD-10-CM

## 2020-06-27 NOTE — Patient Instructions (Addendum)
Schedule mammogram! [facility] [phone] [address]  [place], [place] [address]  Schedule colonoscopy! [facility] [phone] [address] [place], [place] [address]  Health Maintenance, Female Adopting a healthy lifestyle and getting preventive care are important in promoting health and wellness. Ask your health care provider about:  The right schedule for you to have regular tests and exams.  Things you can do on your own to prevent diseases and keep yourself healthy. What should I know about diet, weight, and exercise? Eat a healthy diet  Eat a diet that includes plenty of vegetables, fruits, low-fat dairy products, and lean protein.  Do not eat a lot of foods that are high in solid fats, added sugars, or sodium.   Maintain a healthy weight Body mass index (BMI) is used to identify weight problems. It estimates body fat based on height and weight. Your health care provider can help determine your BMI and help you achieve or maintain a healthy weight. Get regular exercise Get regular exercise. This is one of the most important things you can do for your health. Most adults should:  Exercise for at least 150 minutes each week. The exercise should increase your heart rate and make you sweat (moderate-intensity exercise).  Do strengthening exercises at least twice a week. This is in addition to the moderate-intensity exercise.  Spend less time sitting. Even light physical activity can be beneficial. Watch cholesterol and blood lipids Have your blood tested for lipids and cholesterol at 47 years of age, then have this test every 5 years. Have your cholesterol levels checked more often if:  Your lipid or cholesterol levels are high.  You are older than 47 years of age.  You are at high risk for heart disease. What should I know about cancer screening? Depending on your health history and family history, you may need to have cancer screening  at various ages. This may include screening for:  Breast cancer.  Cervical cancer.  Colorectal cancer.  Skin cancer.  Lung cancer. What should I know about heart disease, diabetes, and high blood pressure? Blood pressure and heart disease  High blood pressure causes heart disease and increases the risk of stroke. This is more likely to develop in people who have high blood pressure readings, are of African descent, or are overweight.  Have your blood pressure checked: ? Every 3-5 years if you are 18-39 years of age. ? Every year if you are 40 years old or older. Diabetes Have regular diabetes screenings. This checks your fasting blood sugar level. Have the screening done:  Once every three years after age 40 if you are at a normal weight and have a low risk for diabetes.  More often and at a younger age if you are overweight or have a high risk for diabetes. What should I know about preventing infection? Hepatitis B If you have a higher risk for hepatitis B, you should be screened for this virus. Talk with your health care provider to find out if you are at risk for hepatitis B infection. Hepatitis C Testing is recommended for:  Everyone born from 1945 through 1965.  Anyone with known risk factors for hepatitis C. Sexually transmitted infections (STIs)  Get screened for STIs, including gonorrhea and chlamydia, if: ? You are sexually active and are younger than 47 years of age. ? You are older than 47 years of age and your health care provider tells you that you are   at risk for this type of infection. ? Your sexual activity has changed since you were last screened, and you are at increased risk for chlamydia or gonorrhea. Ask your health care provider if you are at risk.  Ask your health care provider about whether you are at high risk for HIV. Your health care provider may recommend a prescription medicine to help prevent HIV infection. If you choose to take medicine to  prevent HIV, you should first get tested for HIV. You should then be tested every 3 months for as long as you are taking the medicine. Pregnancy  If you are about to stop having your period (premenopausal) and you may become pregnant, seek counseling before you get pregnant.  Take 400 to 800 micrograms (mcg) of folic acid every day if you become pregnant.  Ask for birth control (contraception) if you want to prevent pregnancy. Osteoporosis and menopause Osteoporosis is a disease in which the bones lose minerals and strength with aging. This can result in bone fractures. If you are 65 years old or older, or if you are at risk for osteoporosis and fractures, ask your health care provider if you should:  Be screened for bone loss.  Take a calcium or vitamin D supplement to lower your risk of fractures.  Be given hormone replacement therapy (HRT) to treat symptoms of menopause. Follow these instructions at home: Lifestyle  Do not use any products that contain nicotine or tobacco, such as cigarettes, e-cigarettes, and chewing tobacco. If you need help quitting, ask your health care provider.  Do not use street drugs.  Do not share needles.  Ask your health care provider for help if you need support or information about quitting drugs. Alcohol use  Do not drink alcohol if: ? Your health care provider tells you not to drink. ? You are pregnant, may be pregnant, or are planning to become pregnant.  If you drink alcohol: ? Limit how much you use to 0-1 drink a day. ? Limit intake if you are breastfeeding.  Be aware of how much alcohol is in your drink. In the [place] one drink equals one 12 oz bottle of beer (355 mL), one 5 oz glass of wine (148 mL), or one 1 oz glass of hard liquor (44 mL). General instructions  Schedule regular health, dental, and eye exams.  Stay current with your vaccines.  Tell your health care provider if: ? You often feel depressed. ? You have ever been  abused or do not feel safe at home. Summary  Adopting a healthy lifestyle and getting preventive care are important in promoting health and wellness.  Follow your health care provider's instructions about healthy diet, exercising, and getting tested or screened for diseases.  Follow your health care provider's instructions on monitoring your cholesterol and blood pressure. This information is not intended to replace advice given to you by your health care provider. Make sure you discuss any questions you have with your health care provider. Document Revised: 03/02/2018 Document Reviewed: 03/02/2018 Elsevier Patient Education  2021 Elsevier Inc.  

## 2020-06-27 NOTE — Progress Notes (Signed)
   Theresa Mosley Aug 07, 1975 [id number]   History:  45 y.o. [id number] presents for annual exam. Amenorrheic/ Mirena inserted in 2014 for cycle regulation. If cycles return after IUD removal she would just like to take OCPs for management. Husband has vasectomy. Complains of pain with intercourse due to vaginal dryness. Is having difficult losing weight. Normal pap history. Has not had screening mammogram.   Gynecologic History No LMP recorded. (Menstrual status: IUD).   Contraception/Family planning: IUD and vasectomy  Health Maintenance Last Pap: 11/2014. Results were: normal Last mammogram: never Last colonoscopy: Never Last Dexa: N/A   Past medical history, past surgical history, family history and social history were all reviewed and documented in the EPIC chart.  ROS:  A ROS was performed and pertinent positives and negatives are included.  Exam:  Vitals:   06/27/20 1123  BP: 120/76  Weight: 157 lb (71.2 kg)  Height: 5' 4.5" (1.638 m)   Body mass index is 26.53 kg/m.  General appearance:  Normal Thyroid:  Symmetrical, normal in size, without palpable masses or nodularity. Respiratory  Auscultation:  Clear without wheezing or rhonchi Cardiovascular  Auscultation:  Regular rate, without rubs, murmurs or gallops  Edema/varicosities:  Not grossly evident Abdominal  Soft,nontender, without masses, guarding or rebound.  Liver/spleen:  No organomegaly noted  Hernia:  None appreciated  Skin  Inspection:  Grossly normal   Breasts: Examined lying and sitting.   Right: Without masses, retractions, discharge or axillary adenopathy.   Left: Without masses, retractions, discharge or axillary adenopathy. Gentitourinary   Inguinal/mons:  Normal without inguinal adenopathy  External genitalia:  Normal  BUS/Urethra/Skene's glands:  Normal  Vagina:  Normal  Cervix:  Stenotic - unable to insert pap brush fully for pap smear. IUD strings not visible  Uterus:  Normal in size,  shape and contour.  Midline and mobile  Adnexa/parametria:     Rt: Without masses or tenderness.   Lt: Without masses or tenderness.  Anus and perineum: Normal  Digital rectal exam: Normal sphincter tone without palpated masses or tenderness  Assessment/Plan:  45 y.o. [id number] for annual exam.   Well female exam with routine gynecological exam - Plan: Cytology - PAP( [place]). Education provided on SBEs, importance of preventative screenings, current guidelines, high calcium diet, regular exercise, and multivitamin daily. Labs with PCP.   Amenorrhea - Plan: Follicle stimulating hormone.  Attempted IUD removal, unsuccessful - Plan: US PELVIS TRANSVAGINAL NON-OB (TV ONLY). IUD strings not visible at exocervix and unable to retrieve due to cervical stenosis.   Dyspareunia in female - related to vaginal dryness. Recommend OTC lubricants or coconut oil for intercourse.   Intrauterine contraceptive device threads lost, subsequent encounter - Plan: US PELVIS TRANSVAGINAL NON-OB (TV ONLY).  IUD strings not visible at exocervix and unable to retrieve due to cervical stenosis. Ultrasound guided removal recommended.  Screening for cervical cancer - Normal Pap history. Pap with HR HPV today.   Screening for breast cancer - Has not had screening mammogram. Discussed current guidelines and importance of preventative screenings. Information provided on [facility]. Normal breast exam today.  Return in 1 year for annual.       Olivia Mackie DNP, 11:47 AM 06/27/2020

## 2020-06-28 LAB — FOLLICLE STIMULATING HORMONE: FSH: 21.5 m[IU]/mL

## 2020-07-01 LAB — CYTOLOGY - PAP
Comment: NEGATIVE
Diagnosis: UNDETERMINED — AB
High risk HPV: NEGATIVE

## 2020-07-29 NOTE — Progress Notes (Signed)
GYNECOLOGY  VISIT   HPI: 45 y.o.   Married  Caucasian  female   [phone] with No LMP recorded. (Menstrual status: IUD).   here for removal of IUD under ultrasound guidance. Had attempted IUD removal on 06/27/20 with Wyline Beady and cervical stenosis was encountered.  Hysteroscopy with dilation and curettage 08/04/10. IUD was placed following this in 2014 to treat her menses.  She is concerned that she may develop heavy menstrual bleeding again.   Used birth control pills in the past.  Had some headaches with them.   She also used Depo Provera in the past.  She gave herself the injections.   Denies migraines for years.  Denies specific aura.  Denies HTN, smoking, breast or liver disease.  Denies personal hx of thromboembolic events.  Mother had a possible DVT.   GYNECOLOGIC HISTORY: No LMP recorded. (Menstrual status: IUD). Contraception: Mirena IUD 2014/Vasectomy Menopausal hormone therapy:  n/a Last mammogram:  Never Last pap smear: 06-27-20 ASCUS:Neg HR HPV, 11-29-14 Neg:Neg HR HPV, 04-07-11 Neg        OB History    Gravida  3   Para  2   Term  2   Preterm      AB  1   Living  2     SAB  1   IAB      Ectopic      Multiple      Live Births                 Patient Active Problem List   Diagnosis Date Noted  . Fever blister   . Thyroid disease   . POLYP, GALLBLADDER 07/05/2007  . UTERINE POLYP 07/05/2007  . VOMITING 07/05/2007  . ABDOMINAL PAIN 07/05/2007    Past Medical History:  Diagnosis Date  . ASCUS on Pap smear 06-2005  . Fever blister   . Raynaud disease   . Thyroid disease    Hypo    Past Surgical History:  Procedure Laterality Date  . CESAREAN SECTION  2003, 2006  . MIRENA     Inserted 05-04-12  . MOUTH SURGERY  1994   WISDOM TEETH    Current Outpatient Medications  Medication Sig Dispense Refill  . levonorgestrel (MIRENA) 20 MCG/24HR IUD 1 each by Intrauterine route once.     No current facility-administered medications for  this visit.     ALLERGIES: Other and Sulfa antibiotics  Family History  Problem Relation Age of Onset  . Hypertension Mother   . Heart attack Maternal Grandfather   . Stroke Father     Social History   Socioeconomic History  . Marital status: Married    Spouse name: Not on file  . Number of children: Not on file  . Years of education: Not on file  . Highest education level: Not on file  Occupational History  . Not on file  Tobacco Use  . Smoking status: Never Smoker  . Smokeless tobacco: Never Used  Vaping Use  . Vaping Use: Never used  Substance and Sexual Activity  . Alcohol use: Yes    Alcohol/week: 0.0 standard drinks    Comment: OCCASIONALLY  . Drug use: No  . Sexual activity: Yes    Birth control/protection: Other-see comments, I.U.D.    Comment: SPOUSE VASECTOMY  MIRENA inserted 05-04-12, intercourse age 13, sexual partners  less than 5  Other Topics Concern  . Not on file  Social History Narrative  . Not on file   Social  Determinants of Health   Financial Resource Strain: Not on file  Food Insecurity: Not on file  Transportation Needs: Not on file  Physical Activity: Not on file  Stress: Not on file  Social Connections: Not on file  Intimate Partner Violence: Not on file    Review of Systems  All other systems reviewed and are negative.   PHYSICAL EXAMINATION:    BP 110/70   Pulse 81   Ht 5' 4.5" (1.638 m)   Wt 157 lb (71.2 kg)   SpO2 97%   BMI 26.53 kg/m     General appearance: alert, cooperative and appears stated age   Pelvic: External genitalia:  no lesions              Urethra:  normal appearing urethra with no masses, tenderness or lesions              Bartholins and Skenes: normal                 Vagina: normal appearing vagina with normal color and discharge, no lesions              Cervix: no lesions                Bimanual Exam:  Uterus:  normal size, contour, position, consistency, mobility, non-tender              Adnexa: no  mass, fullness, tenderness           Attempted IUD removal without ultrasound guidance first.  Speculum placed. Sterile prep with Hibiclens.  Dressing forceps used to attempt IUD removal.  Cervical stenosis encountered.  Paracervical block with 10 cc 1% lidocaine.  Tenaculum to anterior cervical lip.  Os finder and uterine sound used to try to get into endometrial canal.   Transabdominal ultrasound guidance used and stenosis and probable false passage identified posterior to the cervical canal. Procedure discontinued.   Transvaginal ultrasound performed.  Uterus 7.39 x 5.10 x 4.26 cm.  No myometrial masses. IUD located centrally in the endometrial canal and strings seen in the canal.  Right ovary with 2.0 cm simple follicle.  Left ovary normal.  No adnexal masses.  No free fluid.   Chaperone was present for exam:  Claudette Laws.  ASSESSMENT  Expired IUD.  Lost IUD threads, subsequent encounter.  Cervical stenosis.  Vasectomy also for contraception.   PLAN  We discussed the anatomic change making it challenging to remove her IUD.  I recommend removal in an OR setting with ultrasound guidance and a hysteroscope.  Procedure explained.  She will pursue this in the fall as she does not want a procedure during the summer.  She does indicate concerns about having anesthesia and wishes to minimize this as much as possible.  We discussed options for treating heavy menstrual bleeding if this were to return - pills, Depo Provera, endometrial ablation, hysterectomy.    41 min  total time was spent for this patient encounter, including preparation, face-to-face counseling with the patient, coordination of care, and documentation of the encounter.

## 2020-07-31 ENCOUNTER — Encounter: Payer: Self-pay | Admitting: Obstetrics and Gynecology

## 2020-07-31 ENCOUNTER — Telehealth: Payer: Self-pay | Admitting: Obstetrics and Gynecology

## 2020-07-31 ENCOUNTER — Ambulatory Visit (INDEPENDENT_AMBULATORY_CARE_PROVIDER_SITE_OTHER): Payer: BC Managed Care – PPO | Admitting: Obstetrics and Gynecology

## 2020-07-31 ENCOUNTER — Ambulatory Visit (INDEPENDENT_AMBULATORY_CARE_PROVIDER_SITE_OTHER): Payer: BC Managed Care – PPO

## 2020-07-31 ENCOUNTER — Other Ambulatory Visit: Payer: Self-pay

## 2020-07-31 VITALS — BP 110/70 | HR 81 | Ht 64.5 in | Wt 157.0 lb

## 2020-07-31 DIAGNOSIS — T8332XA Displacement of intrauterine contraceptive device, initial encounter: Secondary | ICD-10-CM

## 2020-07-31 DIAGNOSIS — Z538 Procedure and treatment not carried out for other reasons: Secondary | ICD-10-CM

## 2020-07-31 DIAGNOSIS — N882 Stricture and stenosis of cervix uteri: Secondary | ICD-10-CM

## 2020-07-31 DIAGNOSIS — N920 Excessive and frequent menstruation with regular cycle: Secondary | ICD-10-CM

## 2020-07-31 DIAGNOSIS — Z975 Presence of (intrauterine) contraceptive device: Secondary | ICD-10-CM | POA: Diagnosis not present

## 2020-07-31 DIAGNOSIS — Z30432 Encounter for removal of intrauterine contraceptive device: Secondary | ICD-10-CM | POA: Diagnosis not present

## 2020-07-31 DIAGNOSIS — T8332XD Displacement of intrauterine contraceptive device, subsequent encounter: Secondary | ICD-10-CM

## 2020-07-31 NOTE — Telephone Encounter (Signed)
Surgery:  Hysteroscopic removal of IUD, ultrasound guidance  Diagnosis:  Lost IUD threads, expired IUD, cervical stenosis  Location: [facility]  Status: Outpatient  Time: 37 Minutes  Assistant: N/A  Urgency: At Patient's Convenience  Patient prefers to do surgery in the fall.  Pre-Op Appointment: To Be Scheduled  Post-Op Appointment(s): 2 Weeks,    Time Out Of Work: Day Of Surgery, 1 Day Post Op

## 2020-07-31 NOTE — Patient Instructions (Addendum)
Endometrial Ablation Endometrial ablation is a procedure that destroys the thin inner layer of the lining of the uterus (endometrium). This procedure may be done:  To stop heavy menstrual periods.  To stop bleeding that is causing anemia.  To control irregular bleeding.  To treat bleeding caused by small tumors (fibroids) in the endometrium. This procedure is often done as an alternative to major surgery, such as removal of the uterus and cervix (hysterectomy). As a result of this procedure:  You may not be able to have children. However, if you have not yet gone through menopause: ? You may still have a small chance of getting pregnant. ? You will need to use a reliable method of birth control after the procedure to prevent pregnancy.  You may stop having a menstrual period, or you may have only a small amount of bleeding during your period. Menstruation may return several years after the procedure. Tell a health care provider about:  Any allergies you have.  All medicines you are taking, including vitamins, herbs, eye drops, creams, and over-the-counter medicines.  Any problems you or family members have had with the use of anesthetic medicines.  Any blood disorders you have.  Any surgeries you have had.  Any medical conditions you have.  Whether you are pregnant or may be pregnant. What are the risks? Generally, this is a safe procedure. However, problems may occur, including:  A hole (perforation) in the uterus or bowel.  Infection in the uterus, bladder, or vagina.  Bleeding.  Allergic reaction to medicines.  Damage to nearby structures or organs.  An air bubble in the lung (air embolus).  Problems with pregnancy.  Failure of the procedure.  Decreased ability to diagnose cancer in the endometrium. Scar tissue forms after the procedure, making it more difficult to get a sample of the uterine lining. What happens before the procedure? Medicines Ask your health  care provider about:  Changing or stopping your regular medicines. This is especially important if you take diabetes medicines or blood thinners.  Taking medicines such as aspirin and ibuprofen. These medicines can thin your blood. Do not take these medicines before your procedure if your doctor tells you not to take them.  Taking over-the-counter medicines, vitamins, herbs, and supplements. Tests  You will have tests of your endometrium to make sure there are no precancerous cells or cancer cells present.  You may have an ultrasound of the uterus. General instructions  Do not use any products that contain nicotine or tobacco for at least 4 weeks before the procedure. These include cigarettes, chewing tobacco, and vaping devices, such as e-cigarettes. If you need help quitting, ask your health care provider.  You may be given medicines to thin the endometrium.  Ask your health care provider what steps will be taken to help prevent infection. These steps may include: ? Removing hair at the surgery site. ? Washing skin with a germ-killing soap. ? Taking antibiotic medicine.  Plan to have a responsible adult take you home from the hospital or clinic.  Plan to have a responsible adult care for you for the time you are told after you leave the hospital or clinic. This is important. What happens during the procedure?  You will lie on an exam table with your feet and legs supported as in a pelvic exam.  An IV will be inserted into one of your veins.  You will be given a medicine to help you relax (sedative).  A surgical tool with   a light and camera (resectoscope) will be inserted into your vagina and moved into your uterus. This allows your surgeon to see inside your uterus.  Endometrial tissue will be destroyed and removed, using one of the following methods: ? Radiofrequency. This uses an electrical current to destroy the endometrium. ? Cryotherapy. This uses extreme cold to freeze  the endometrium. ? Heated fluid. This uses a heated salt and water (saline) solution to destroy the endometrium. ? Microwave. This uses high-energy microwaves to heat up the endometrium and destroy it. ? Thermal balloon. This involves inserting a catheter with a balloon tip into the uterus. The balloon tip is filled with heated fluid to destroy the endometrium. The procedure may vary among health care providers and hospitals.   What happens after the procedure?  Your blood pressure, heart rate, breathing rate, and blood oxygen level will be monitored until you leave the hospital or clinic.  You may have vaginal bleeding for 4-6 weeks after the procedure. You may also have: ? Cramps. ? A thin, watery vaginal discharge that is light pink or brown. ? A need to urinate more than usual. ? Nausea.  If you were given a sedative during the procedure, it can affect you for several hours. Do not drive or operate machinery until your health care provider says that it is safe.  Do not have sex or insert anything into your vagina until your health care provider says it is safe. Summary  Endometrial ablation is done to treat many causes of heavy menstrual bleeding. The procedure destroys the thin inner layer of the lining of the uterus (endometrium).  This procedure is often done as an alternative to major surgery, such as removal of the uterus and cervix (hysterectomy).  Plan to have a responsible adult take you home from the hospital or clinic. This information is not intended to replace advice given to you by your health care provider. Make sure you discuss any questions you have with your health care provider. Document Revised: 09/28/2019 Document Reviewed: 09/28/2019 Elsevier Patient Education  2021 Elsevier Inc.   Hysteroscopy Hysteroscopy is a procedure used to look inside a woman's womb (uterus). This may be done for various reasons, including:  To look for tumors and other growths in the  uterus.  To evaluate abnormal bleeding, fibroid tumors, polyps, scar tissue, or uterine cancer.  To determine why a woman is unable to get pregnant or has had repeated pregnancy losses.  To locate an IUD (intrauterine device).  To place a birth control device into the fallopian tubes. During this procedure, a thin, flexible tube with a small light and camera (hysteroscope) is used to examine the uterus. The camera sends images to a monitor in the room so that your health care provider can view the inside of your uterus. A hysteroscopy should be done right after a menstrual period. Tell a health care provider about:  Any allergies you have.  All medicines you are taking, including vitamins, herbs, eye drops, creams, and over-the-counter medicines.  Any problems you or family members have had with anesthetic medicines.  Any blood disorders you have.  Any surgeries you have had.  Any medical conditions you have.  Whether you are pregnant or may be pregnant.  Whether you have been diagnosed with an STI (sexually transmitted infection) or you think you have an STI. What are the risks? Generally, this is a safe procedure. However, problems may occur, including:  Excessive bleeding.  Infection.  Damage to the  uterus or other structures or organs.  Allergic reaction to medicines or fluids that are used in the procedure. What happens before the procedure? Staying hydrated Follow instructions from your health care provider about hydration, which may include:  Up to 2 hours before the procedure - you may continue to drink clear liquids, such as water, clear fruit juice, black coffee, and plain tea. Eating and drinking restrictions Follow instructions from your health care provider about eating and drinking, which may include:  8 hours before the procedure - stop eating solid foods and drink clear liquids only.  2 hours before the procedure - stop drinking clear  liquids. Medicines  Ask your health care provider about: ? Changing or stopping your regular medicines. This is especially important if you are taking diabetes medicines or blood thinners. ? Taking medicines such as aspirin and ibuprofen. These medicines can thin your blood. Do not take these medicines unless your health care provider tells you to take them. ? Taking over-the-counter medicines, vitamins, herbs, and supplements.  Medicine may be placed in your cervix the day before the procedure. This medicine causes the cervix to open (dilate). The larger opening makes it easier for the hysteroscope to be inserted into the uterus during the procedure. General instructions  Ask your health care provider: ? What steps will be taken to help prevent infection. These steps may include:  Washing skin with a germ-killing soap.  Taking antibiotic medicine.  Do not use any products that contain nicotine or tobacco for at least 4 weeks before the procedure. These products include cigarettes, chewing tobacco, and vaping devices, such as e-cigarettes. If you need help quitting, ask your health care provider.  Plan to have a responsible adult take you home from the hospital or clinic.  Plan to have a responsible adult care for you for the time you are told after you leave the hospital or clinic. This is important.  Empty your bladder before the procedure begins. What happens during the procedure?  An IV will be inserted into one of your veins.  You may be given: ? A medicine to help you relax (sedative). ? A medicine that numbs the area around the cervix (local anesthetic). ? A medicine to make you fall asleep (general anesthetic).  A hysteroscope will be inserted through your vagina and into your uterus.  Air or fluid will be used to enlarge your uterus to allow your health care provider to see it better. The amount of fluid used will be carefully checked throughout the procedure.  In some  cases, tissue may be gently scraped from inside the uterus and sent to a lab for testing (biopsy). The procedure may vary among health care providers and hospitals. What can I expect after the procedure?  Your blood pressure, heart rate, breathing rate, and blood oxygen level will be monitored until you leave the hospital or clinic.  You may have cramps. You may be given medicines for this.  You may have bleeding, which may vary from light spotting to menstrual-like bleeding. This is normal.  If you had a biopsy, it is up to you to get the results. Ask your health care provider, or the department that is doing the procedure, when your results will be ready. Follow these instructions at home: Activity  Rest as told by your health care provider.  Return to your normal activities as told by your health care provider. Ask your health care provider what activities are safe for you.  If  you were given a sedative during the procedure, it can affect you for several hours. Do not drive or operate machinery until your health care provider says that it is safe. Medicines  Do not take aspirin or other NSAIDs during recovery, as told by your healthcare provider. It can increase the risk of bleeding.  Ask your health care provider if the medicine prescribed to you: ? Requires you to avoid driving or using machinery. ? Can cause constipation. You may need to take these actions to prevent or treat constipation:  Drink enough fluid to keep your urine pale yellow.  Take over-the-counter or prescription medicines.  Eat foods that are high in fiber, such as beans, whole grains, and fresh fruits and vegetables.  Limit foods that are high in fat and processed sugars, such as fried or sweet foods. General instructions  Do not douche, use tampons, or have sex for 2 weeks after the procedure, or until your health care provider approves.  Do not take baths, swim, or use a hot tub until your health care  provider approves. Take showers instead of baths for 2 weeks, or for as long as told by your health care provider.  Keep all follow-up visits. This is important. Contact a health care provider if:  You feel dizzy or lightheaded.  You feel nauseous.  You have abnormal vaginal discharge.  You have a rash.  You have pain that does not get better with medicine.  You have chills. Get help right away if:  You have bleeding that is heavier than a normal menstrual period.  You have a fever.  You have pain or cramps that get worse.  You develop new abdominal pain.  You faint.  You have pain in your shoulder.  You are short of breath. Summary  Hysteroscopy is a procedure that is used to look inside a woman's womb (uterus).  After the procedure, you may have bleeding, which varies from light spotting to menstrual-like bleeding. This is normal. You may also have cramps.  Do not douche, use tampons, or have sex for 2 weeks after the procedure, or until your health care provider approves.  Plan to have a responsible adult take you home from the hospital or clinic. This information is not intended to replace advice given to you by your health care provider. Make sure you discuss any questions you have with your health care provider. Document Revised: 10/25/2019 Document Reviewed: 10/25/2019 Elsevier Patient Education  2021 [employer].

## 2020-08-01 NOTE — Telephone Encounter (Signed)
Surgical Scheduling checklist started for this patient. Placed on Glorianne Manchester RN desk to set up surgery of patient.      Insurance: [employer] ID: [id number] CPT: 48546,27035 DX: [id number],[id number]  Effective Date: 02/21/2020 Benefit Period: 02/19/2021  Date: 08/01/2020 Reference #:[id number]   Copay: 50%   Deductible: HSA Family plan 12,000(4,751mt) OOP: $14,000 / $4,722.18 met) once met 100% covered Coinsurance: 50%  Prior authorization: No PA needed  Allowed Amount: $787.63 PR: 393.81

## 2020-08-05 NOTE — Telephone Encounter (Signed)
Spoke with patient. Reviewed surgery dates.  Patient request to schedule in 11/2020. States she was advised she will need to stay out of the pool for 2weeks after surgery, would rather wait until end of summer. Patient states she discussed this with Dr. Edward Jolly at her OV.  Patient also wants to as little sedation as possible for the surgery, would like to discuss options with anesthesia.   Reviewed surgery dates, patient request to proceed with scheduling for 11/26/20 after she has confirmed options for anesthesia. Advised I will review anesthesia options with PAT at [facility] and f/u. Patient agreeable.

## 2020-08-06 NOTE — Telephone Encounter (Signed)
Theresa Candle, RN  Theresa Min, RN Hi, Theresa Mosley  Whatever anesthesia is scheduled/ posted , anesthesia does not consult with pt prior to surgery at [facility], pt will speak with them in pre-op day of surgery. Pt will get an IV with anesthesia medication not just IV sedation as she may be thinking. Hope this helps.   Thanks  Denise   Dr. Edward Jolly -see response from [facility] PAT and advise on anesthesia choice.

## 2020-08-06 NOTE — Telephone Encounter (Signed)
The patient indicated she wants to start with IV sedation and a local paracervical block.  I am ok with that.  It will be important for her to understand that if this does not provide adequate anesthetic, she may need laryngeal mask anesthesia or even general endotracheal anesthesia.  Her alternative to these last two would be be a spinal anesthetic.

## 2020-08-09 DIAGNOSIS — Z20822 Contact with and (suspected) exposure to covid-19: Secondary | ICD-10-CM | POA: Diagnosis not present

## 2020-08-12 NOTE — Telephone Encounter (Signed)
Spoke with patient. Advised per Dr. Edward Jolly. Patient agreeable to plan and request to proceed with surgery on 11/26/20.  Advised I will return call once date and time confirmed. Patient agreeable.   Surgery request sent.

## 2020-08-15 NOTE — Telephone Encounter (Signed)
Spoke with patient. Surgery date request confirmed.  Advised surgery is scheduled for 11/26/20, [facility] 0730 Surgery instruction sheet and hospital brochure reviewed, printed copy will be mailed. Pre-op on 10/31/20 at 1030.   Routing to provider. Encounter closed.   Cc: Bari Mantis

## 2020-08-20 NOTE — Telephone Encounter (Signed)
Spoke with patient regarding benefits for scheduled Hysteroscopic Removal of IUD, Ultrasound Guidance. Patient acknowledges understanding of information presented. Encounter closed.

## 2020-10-28 NOTE — Progress Notes (Signed)
GYNECOLOGY  VISIT   HPI: 45 y.o.   Married  Caucasian  female   [phone] with No LMP recorded. (Menstrual status: IUD).   here for pre-op exam.    Had attempted IUD removal on 06/27/20 with Wyline Beady and cervical stenosis was encountered.  Ultrasound guided removal of the IUD was not successful by me 07/31/20.  A false passage was created.  Transvaginal ultrasound findings were as follows: Uterus 7.39 x 5.10 x 4.26 cm.  No myometrial masses. IUD located centrally in the endometrial canal and strings seen in the canal.  Right ovary with 2.0 cm simple follicle.  Left ovary normal.  No adnexal masses.  No free fluid.   She denies vaginal bleeding but did have clear discharge following the procedure.    Hysteroscopy with dilation and curettage 08/04/10. IUD was placed following this in 2014 to treat her menses.   Patient desires IUD removal and desires the least amount of anesthetic possible. She is really concerned about undergoing anesthesia.   She has painful LE varicose veins and would like assistance with this.  GYNECOLOGIC HISTORY: No LMP recorded. (Menstrual status: IUD). Contraception: Vasectomy/Mirena IUD 2014 Menopausal hormone therapy:  n/a Last mammogram:  Never Last pap smear:  06-27-20 ASCUS:Neg HR HPV, 11-29-14 Neg:Neg HR HPV, 04-07-11 Neg        OB History     Gravida  3   Para  2   Term  2   Preterm      AB  1   Living  2      SAB  1   IAB      Ectopic      Multiple      Live Births                 Patient Active Problem List   Diagnosis Date Noted   Fever blister    Thyroid disease    POLYP, GALLBLADDER 07/05/2007   UTERINE POLYP 07/05/2007   VOMITING 07/05/2007   ABDOMINAL PAIN 07/05/2007    Past Medical History:  Diagnosis Date   ASCUS on Pap smear 06/21/2005   Fever blister    Raynaud disease    Thyroid disease    Hypo   Varicose vein of leg     Past Surgical History:  Procedure Laterality Date   CESAREAN SECTION   2003, 2006   HYSTEROSCOPY WITH D & C  08/04/2010   MIRENA     Inserted 05-04-12   MOUTH SURGERY  03/23/1992   WISDOM TEETH    Current Outpatient Medications  Medication Sig Dispense Refill   levonorgestrel (MIRENA) 20 MCG/24HR IUD 1 each by Intrauterine route once.     No current facility-administered medications for this visit.     ALLERGIES: Other and Sulfa antibiotics  Family History  Problem Relation Age of Onset   Hypertension Mother    Heart attack Maternal Grandfather    Stroke Father     Social History   Socioeconomic History   Marital status: Married    Spouse name: Not on file   Number of children: Not on file   Years of education: Not on file   Highest education level: Not on file  Occupational History   Not on file  Tobacco Use   Smoking status: Never   Smokeless tobacco: Never  Vaping Use   Vaping Use: Never used  Substance and Sexual Activity   Alcohol use: Yes    Alcohol/week: 0.0 standard drinks  Comment: OCCASIONALLY   Drug use: No   Sexual activity: Yes    Birth control/protection: Other-see comments, I.U.D.    Comment: SPOUSE VASECTOMY  MIRENA inserted 05-04-12, intercourse age 57, sexual partners  less than 5  Other Topics Concern   Not on file  Social History Narrative   Not on file   Social Determinants of Health   Financial Resource Strain: Not on file  Food Insecurity: Not on file  Transportation Needs: Not on file  Physical Activity: Not on file  Stress: Not on file  Social Connections: Not on file  Intimate Partner Violence: Not on file    Review of Systems  All other systems reviewed and are negative.  PHYSICAL EXAMINATION:    BP 122/80   Pulse 84   Ht 5' 4.5" (1.638 m)   Wt 157 lb (71.2 kg)   SpO2 98%   BMI 26.53 kg/m     General appearance: alert, cooperative and appears stated age Lungs: clear to auscultation bilaterally Heart: regular rate and rhythm Abdomen: soft, non-tender, no masses,  no  organomegaly Extremities: extremities normal, atraumatic, no cyanosis or edema.  Varicose veins of left leg. Skin: Skin color, texture, turgor normal. No rashes or lesions No abnormal inguinal nodes palpated Neurologic: Grossly normal  Pelvic: External genitalia:  no lesions              Urethra:  normal appearing urethra with no masses, tenderness or lesions              Bartholins and Skenes: normal                 Vagina: normal appearing vagina with normal color and discharge, no lesions              Cervix: no lesions                Bimanual Exam:  Uterus:  normal size, contour, position, consistency, mobility, non-tender              Adnexa: no mass, fullness, tenderness    Chaperone was present for exam:  Marchelle Folks, CMA.  ASSESSMENT  Expired IUD.  Lost IUD threads, subsequent encounter.  Cervical stenosis.  Vasectomy also for contraception.  Varicose veins.   PLAN  We spoke at length about ultrasound guided and hysteroscopically assisted removal of her IUD in an outpatient setting.   We discussed that IV sedation and paracervical block may be adequate, but that LMA anesthetic or general anesthesia may also be needed.  Risks include but are not limited to bleeding, infection, damage to surrounding organs including uterine perforation requiring hospitalization and laparoscopy,  DVT, PE, death. Risk of maintaining the IUD and not removing it may be infection.  Surgical expectations and recovery discussed.   Due to the patient's concern about anesthesia, I have suggested a second opinion and will make a referral to Dr. Carin Hock, GYN ONC.  She may see [facility].  I gave her the name of this office.   An After Visit Summary was printed and given to the patient.    39 min  total time was spent for this patient encounter, including preparation, face-to-face counseling with the patient, coordination of care, and documentation of the encounter.

## 2020-10-31 ENCOUNTER — Telehealth: Payer: Self-pay | Admitting: Obstetrics and Gynecology

## 2020-10-31 ENCOUNTER — Telehealth: Payer: Self-pay | Admitting: Oncology

## 2020-10-31 ENCOUNTER — Other Ambulatory Visit: Payer: Self-pay

## 2020-10-31 ENCOUNTER — Ambulatory Visit (INDEPENDENT_AMBULATORY_CARE_PROVIDER_SITE_OTHER): Payer: BC Managed Care – PPO | Admitting: Obstetrics and Gynecology

## 2020-10-31 ENCOUNTER — Encounter: Payer: Self-pay | Admitting: Obstetrics and Gynecology

## 2020-10-31 VITALS — BP 122/80 | HR 84 | Ht 64.5 in | Wt 157.0 lb

## 2020-10-31 DIAGNOSIS — T8332XD Displacement of intrauterine contraceptive device, subsequent encounter: Secondary | ICD-10-CM

## 2020-10-31 DIAGNOSIS — I8390 Asymptomatic varicose veins of unspecified lower extremity: Secondary | ICD-10-CM | POA: Diagnosis not present

## 2020-10-31 DIAGNOSIS — N882 Stricture and stenosis of cervix uteri: Secondary | ICD-10-CM

## 2020-10-31 DIAGNOSIS — T8332XA Displacement of intrauterine contraceptive device, initial encounter: Secondary | ICD-10-CM

## 2020-10-31 NOTE — Telephone Encounter (Signed)
Please make a referral to Dr. Carin Hock at [facility].   The patient has a stenotic cervix that is somewhat flush with the vaginal cuff.  I tried to remove her expired Mirena IUD in the office under ultrasound guidance, and I was not successful.  I created a false passage.   Patient would really like to avoid anesthesia in the OR setting for removal, so I suggested a second opinion and attempt for removal in the office.   Patient is not available all next week and the Monday and Tuesday following this.   She is currently scheduled for surgery on 11/26/20.

## 2020-10-31 NOTE — Telephone Encounter (Signed)
Left a message with appointment to see Dr. Pricilla Holm tomorrow at 10:45.  Requested a return call to confirm.

## 2020-10-31 NOTE — Telephone Encounter (Signed)
Theresa Mosley called back and was scheduled for tomorrow to see Dr. Pricilla Holm.

## 2020-10-31 NOTE — Patient Instructions (Signed)
Consider  [facility] [address] for treatment of varicose veins.

## 2020-10-31 NOTE — Telephone Encounter (Signed)
Staff message sent to [facility] to call and schedule and let me know.

## 2020-11-01 ENCOUNTER — Encounter: Payer: Self-pay | Admitting: Gynecologic Oncology

## 2020-11-01 ENCOUNTER — Inpatient Hospital Stay: Payer: BC Managed Care – PPO | Attending: Gynecologic Oncology | Admitting: Gynecologic Oncology

## 2020-11-01 VITALS — BP 123/78 | HR 71 | Temp 98.6°F | Ht 64.0 in | Wt 157.9 lb

## 2020-11-01 DIAGNOSIS — E079 Disorder of thyroid, unspecified: Secondary | ICD-10-CM | POA: Insufficient documentation

## 2020-11-01 DIAGNOSIS — Z975 Presence of (intrauterine) contraceptive device: Secondary | ICD-10-CM | POA: Insufficient documentation

## 2020-11-01 DIAGNOSIS — N882 Stricture and stenosis of cervix uteri: Secondary | ICD-10-CM | POA: Diagnosis not present

## 2020-11-01 DIAGNOSIS — T8339XA Other mechanical complication of intrauterine contraceptive device, initial encounter: Secondary | ICD-10-CM | POA: Diagnosis not present

## 2020-11-01 DIAGNOSIS — I7300 Raynaud's syndrome without gangrene: Secondary | ICD-10-CM | POA: Insufficient documentation

## 2020-11-01 NOTE — Patient Instructions (Signed)
It was a pleasure meeting you today. Please let me know if I can be of help in the future.  I will send a note to Dr. Edward Jolly to let her know what I tried today and what I found.

## 2020-11-01 NOTE — Progress Notes (Signed)
GYNECOLOGIC ONCOLOGY NEW PATIENT CONSULTATION   Patient Name: Theresa Mosley  Patient Age: 45 y.o. Date of Service: 11/01/20 Referring Provider: Josefa Half, MD  Primary Care Provider: Prince Solian, MD Consulting Provider: Jeral Pinch, MD   Assessment/Plan:  Likely perimenopausal patient with cervical stenosis that developed after her Mirena IUD was placed with strings now within the endocervical canal.  Attempted IUD removal today.  I am fairly confident that I was within the endocervical canal given ease with which I was able to pass instruments initially.  I was not able to appreciate or grasp the IUD strings.  Her uterus and cervix are retroverted, which was in line with what I found on dilation of her cervix today.  We discussed options for moving forward.  She is scheduled for an attempted IUD removal under anesthesia early in September.  From a safety standpoint, it would not be unsafe for her to keep the IUD in place.  While it is not approved for birth control at this point, she is not using it for such and her partner has vasectomy.  She has either stopped having menses or her IUD is still secreting enough progesterone to inhibit endometrial growth and subsequent menses.  I think that even with her cervical stenosis, there was not evidence on the outside ultrasound that was done of significant fluid within the endometrial canal.  If she elects to proceed with IUD removal in the operating room, I think it would be reasonable to do this under local anesthesia with MAC.  If she is too uncomfortable, she could be transition to LMA.  We discussed the risk of her starting to have menses again if and when her IUD is removed.  It is possible that she would begin menstruating, although I do think that she is likely perimenopausal.  I think it would be reasonable to replace a Mirena IUD at the time of surgery and leave the strings longer to hopefully prevent this happen this time.   Alternatively, she could be on progesterone only pills if she develops abnormal or heavier bleeding.  In terms of the tools that can be used in the operating room, I would suggest that plan be for hysteroscopy as well as possible ultrasound guidance.  I answered all of the patient's questions today.  I am available to help with her care in any way needed in the future.  A copy of this note was sent to the patient's referring provider.   60 minutes of total time was spent for this patient encounter, including preparation, face-to-face counseling with the patient and coordination of care, and documentation of the encounter.  Jeral Pinch, MD  Division of Gynecologic Oncology  Department of Obstetrics and Gynecology  [facility]  ___________________________________________  Chief Complaint: No chief complaint on file.   History of Present Illness:  Theresa Mosley is a 45 y.o. y.o. female who is seen in consultation at the request of Dr. Quincy Simmonds for an evaluation of cervical stenosis.  The patient initially underwent an attempt at removal of her Mirena IUD, which was placed in 2014 for treatment of abnormal uterine bleeding.  She had initial attempt in early April where cervical stenosis was noted.  Ultrasound-guided attempt at removal in mid May was unsuccessful and it was felt that a false passage within the cervix had been created.  She is scheduled for exam under anesthesia with IUD removal on September 6.  She very much wishes to avoid intubation  and anesthesia if possible.  She initially underwent hysteroscopy and D&C on 08/04/2010 in the setting of heavy bleeding and abnormal uterine bleeding.  Pathology from a biopsy prior to that procedure revealed secretory phase endometrium, no hyperplasia or malignancy.  The patient then had a Mirena IUD placed in 2014 for recurrence of abnormal uterine bleeding.  She has basically been amenorrheic with IUD in place and  reports only occasional spotting.  She denies any pelvic pain or cramping.  More recently, she has developed some perimenopausal symptoms including vaginal dryness, weight gain, and occasional hot flashes.  She had an [facility] tested in April that was 21.5, which may indicate that she is perimenopausal.  Ultrasound in the office in May showed a uterus measuring 7.4 x 5.1 x 2.3 cm, IUD located centrally within the uterine cavity and strings seen within the endocervical canal.  PAST MEDICAL HISTORY:  Past Medical History:  Diagnosis Date   ASCUS on Pap smear 06/21/2005   Fever blister    Raynaud disease    Thyroid disease    Hypo   Varicose vein of leg      PAST SURGICAL HISTORY:  Past Surgical History:  Procedure Laterality Date   CESAREAN SECTION  2003, 2006   HYSTEROSCOPY WITH D & C  08/04/2010   MIRENA     Inserted 05-04-12   MOUTH SURGERY  03/23/1992   WISDOM TEETH    OB/GYN HISTORY:  OB History  Gravida Para Term Preterm AB Living  _0 SAB IAB Ectopic Multiple Live Births  1            # Outcome Date GA Lbr Len/2nd Weight Sex Delivery Anes PTL Lv  3 SAB           2 Term           1 Term             No LMP recorded. (Menstrual status: IUD).  Hx of HRT: n/a Hx of STDs: denies Last pap: 06/2020 - ASCUS, HR HPV negative History of abnormal pap smears: mildly abnormal requiring more close follow-up, denies any biopsies or procedures on her cervix  SCREENING STUDIES:  Last mammogram: Has never had  Last colonoscopy: Had one at the age of 19 or so but has not had one since  MEDICATIONS: Outpatient Encounter Medications as of 11/01/2020  Medication Sig   levonorgestrel (MIRENA) 20 MCG/24HR IUD 1 each by Intrauterine route once.   No facility-administered encounter medications on file as of 11/01/2020.    ALLERGIES:  Allergies  Allergen Reactions   Other Nausea And Vomiting    Walnuts cause nausea and vomiting.   Sulfa Antibiotics Nausea And Vomiting      FAMILY HISTORY:  Family History  Problem Relation Age of Onset   Hypertension Mother    Stroke Father    Esophageal cancer Father    Heart attack Maternal Grandfather    Breast cancer Neg Hx    Uterine cancer Neg Hx    Ovarian cancer Neg Hx    Colon cancer Neg Hx      SOCIAL HISTORY:  Social Connections: Not on file    REVIEW OF SYSTEMS:  Denies appetite changes, fevers, chills, fatigue, unexplained weight changes. Denies hearing loss, neck lumps or masses, mouth sores, ringing in ears or voice changes. Denies cough or wheezing.  Denies shortness of breath. Denies chest pain or palpitations. Denies leg swelling. Denies abdominal distention, pain,  blood in stools, constipation, diarrhea, nausea, vomiting, or early satiety. Denies pain with intercourse, dysuria, frequency, hematuria or incontinence. Denies hot flashes, pelvic pain, vaginal bleeding or vaginal discharge.   Denies joint pain, back pain or muscle pain/cramps. Denies itching, rash, or wounds. Denies dizziness, headaches, numbness or seizures. Denies swollen lymph nodes or glands, denies easy bruising or bleeding. Denies anxiety, depression, confusion, or decreased concentration.  Physical Exam:  Vital Signs for this encounter:  Blood pressure 123/78, pulse 71, temperature 98.6 F (37 C), temperature source Oral, height _0  (1.626 m), weight 157 lb 14.4 oz (71.6 kg), SpO2 98 %. Body mass index is 27.1 kg/m. General: Alert, oriented, no acute distress.  HEENT: Normocephalic, atraumatic. Sclera anicteric.  Chest: Clear to auscultation bilaterally. No wheezes, rhonchi, or rales. Cardiovascular: Regular rate and rhythm, no murmurs, rubs, or gallops.  Abdomen: Normoactive bowel sounds. Soft, nondistended, nontender to palpation. No masses or hepatosplenomegaly appreciated. No palpable fluid wave.  Well-healed Pfannenstiel incision. Extremities: Grossly normal range of motion. Warm, well perfused. No edema  bilaterally.  Skin: No rashes or lesions.  Lymphatics: No cervical, supraclavicular, or inguinal adenopathy.  GU:  Normal external female genitalia. No lesions. No discharge or bleeding.             Bladder/urethra:  No lesions or masses, well supported bladder             Vagina: Well rugated, no lesions or masses.  Somewhat flush with the vaginal apex.             Cervix: Normal appearing, no lesions.  Transformational zone is on the face of the cervix.  Cervix mildly stenotic in appearance.             Uterus: Small, mobile, retroverted.             Adnexa: No masses appreciated.   Attempt at IUD removal: After the procedure was discussed with the patient including risks, benefits, and alternatives, the patient gave verbal consent.  She was placed in dorsolithotomy position and a bimanual exam was performed.  Speculum was then placed in the vagina and the cervix was well visualized.  Betadine was used x3 to cleanse the cervix.  A tenaculum was placed on the anterior lip of the cervix.  An endometrial Pipelle was easily passed to a depth of about 4 cm, suspected to be the internal os.  An os finder was then used to dilate this passageway.  Polyp forceps were passed multiple times within the canal and no string was able to be grasped.  Procedure was abandoned at this point.  LABORATORY AND RADIOLOGIC DATA:  Outside medical records were reviewed to synthesize the above history, along with the history and physical obtained during the visit.   Lab Results  Component Value Date   WBC 7.8 10/19/2006   HGB 14.0 10/19/2006   HCT 40.5 10/19/2006   PLT 185 10/19/2006   GLUCOSE 98 10/19/2006   ALT 15 10/19/2006   AST 19 10/19/2006   NA 137 10/19/2006   K 3.6 10/19/2006   CL 103 10/19/2006   CREATININE 0.6 10/19/2006   BUN 8 10/19/2006   CO2 29 10/19/2006   TSH 2.28 10/19/2006

## 2020-11-01 NOTE — Telephone Encounter (Signed)
Patient scheduled on 11/01/20 with Dr.Tucker.

## 2020-11-05 ENCOUNTER — Telehealth: Payer: Self-pay | Admitting: Obstetrics and Gynecology

## 2020-11-05 NOTE — Telephone Encounter (Signed)
Left message to call Shanetta Nicolls, RN at [facility], [phone].  

## 2020-11-05 NOTE — Telephone Encounter (Signed)
Please contact patient to see if she is requesting a new Mirena IUD be replaced at the time of her IUD removal in the OR setting.   We were not currently planning on this.   She does have a history of menorrhagia.   Vasectomy is the method of contraception.

## 2020-11-12 NOTE — Telephone Encounter (Signed)
Spoke with patient.  Patient request to proceed with Mirena IUD insertion on 11/26/20. Would like to know benefits. Advised I will forward to the business office for return call. I will f/u once benefits reviewed and update surgery.  Patient verbalizes understanding and is agreeable.   Routing to [employer]

## 2020-11-13 NOTE — Telephone Encounter (Signed)
Spoke with patient regarding surgery benefits. Patient acknowledges understanding of information presented. Patient is aware that benefits presented are professional benefits only.  Patient wishes to proceed with reinsertion of IUD in the OR.  Routing to Carmelina Dane, RN to update surgery.

## 2020-11-13 NOTE — Telephone Encounter (Signed)
Call to patient. Per DPR, OK to leave message on voicemail.   Left voicemail requesting a return call to review benefits for Scheduled Surgery with Brook Silva, MD, FACOG.  

## 2020-11-14 ENCOUNTER — Telehealth: Payer: Self-pay | Admitting: *Deleted

## 2020-11-14 ENCOUNTER — Other Ambulatory Visit (HOSPITAL_COMMUNITY): Payer: Self-pay | Admitting: Obstetrics and Gynecology

## 2020-11-14 ENCOUNTER — Other Ambulatory Visit: Payer: Self-pay | Admitting: Obstetrics and Gynecology

## 2020-11-14 DIAGNOSIS — T8332XA Displacement of intrauterine contraceptive device, initial encounter: Secondary | ICD-10-CM

## 2020-11-14 NOTE — Telephone Encounter (Signed)
Patient returned call. Patient has spoken with [facility] regarding hospital benefits, may need to cancel surgery for 9/6. Patient is waiting for additional info from employer, will return call by end of the business day to advise how she would like to proceed.   Routing to [employer].

## 2020-11-14 NOTE — Telephone Encounter (Signed)
Spoke with Theresa Mosley is [employer].  Case updated to include Mirena IUD insertion.  Patient notified -left detailed message, ok per dpr.    Routing to provider for final review. Patient is agreeable to disposition. Will close encounter.  Cc: Hayley Carder

## 2020-11-15 ENCOUNTER — Encounter: Payer: Self-pay | Admitting: Obstetrics and Gynecology

## 2020-11-15 NOTE — Telephone Encounter (Signed)
Patient left message stating she would like to cancel surgery scheduled for 11/26/20 due to out of pocket cost. Would like to review any additional options. May consider rescheduling to 01/2021.   Call placed to [facility], spoke with Selena Batten. Surgery cancelled for 11/26/20.   Call placed to patient. Left detailed message, ok per dpr. Advised surgery has been cancelled. Will forward to Dr. Edward Jolly to review and return call next week with recommendations. Return call to office if any additional questions.   Dr. Edward Jolly -please review and advise if any additional recommendations.    Cc: Hayley Carder

## 2020-11-15 NOTE — Telephone Encounter (Signed)
Message received to cancel surgery at this time.   No further recommendations from me.   I will wait to hear from the patient.   Patient has had consultation with Dr. Pricilla Holm from [facility].

## 2020-11-18 NOTE — Telephone Encounter (Signed)
Spoke with patient.  Patient states she wants to reach out to [facility] and [facility] to review clinic options. Patient states she will f/u within the next 2 wks with her plan. States she may want to schedule in 01/2021, but she is unsure at this time. Reviewed November surgery dates. Patient thankful for f/u.   Will keep surgery chart for f/u.   Routing to Dr. Marjorie Smolder.  Cc: Hayley Carder

## 2020-11-26 ENCOUNTER — Encounter (HOSPITAL_BASED_OUTPATIENT_CLINIC_OR_DEPARTMENT_OTHER): Admission: RE | Payer: Self-pay | Source: Home / Self Care

## 2020-11-26 ENCOUNTER — Ambulatory Visit (HOSPITAL_COMMUNITY): Payer: BC Managed Care – PPO

## 2020-11-26 ENCOUNTER — Ambulatory Visit (HOSPITAL_BASED_OUTPATIENT_CLINIC_OR_DEPARTMENT_OTHER)
Admission: RE | Admit: 2020-11-26 | Payer: BC Managed Care – PPO | Source: Home / Self Care | Admitting: Obstetrics and Gynecology

## 2020-11-26 SURGERY — HYSTEROSCOPY
Anesthesia: Choice

## 2020-12-11 ENCOUNTER — Encounter: Payer: Self-pay | Admitting: Obstetrics and Gynecology

## 2021-06-30 ENCOUNTER — Ambulatory Visit: Payer: BC Managed Care – PPO | Admitting: Nurse Practitioner

## 2021-07-24 ENCOUNTER — Other Ambulatory Visit: Payer: Self-pay | Admitting: Internal Medicine

## 2021-07-24 DIAGNOSIS — Z1231 Encounter for screening mammogram for malignant neoplasm of breast: Secondary | ICD-10-CM

## 2021-07-25 ENCOUNTER — Ambulatory Visit
Admission: RE | Admit: 2021-07-25 | Discharge: 2021-07-25 | Disposition: A | Discharge status: Home / Self Care | Payer: BC Managed Care – PPO | Source: Ambulatory Visit | Attending: Internal Medicine | Admitting: Internal Medicine

## 2021-07-25 DIAGNOSIS — Z1231 Encounter for screening mammogram for malignant neoplasm of breast: Secondary | ICD-10-CM

## 2021-07-28 ENCOUNTER — Other Ambulatory Visit: Payer: Self-pay | Admitting: Internal Medicine

## 2021-07-28 DIAGNOSIS — R928 Other abnormal and inconclusive findings on diagnostic imaging of breast: Secondary | ICD-10-CM

## 2023-10-06 ENCOUNTER — Other Ambulatory Visit: Payer: Self-pay | Admitting: Vascular Surgery

## 2023-10-06 DIAGNOSIS — M7989 Other specified soft tissue disorders: Secondary | ICD-10-CM

## 2023-10-20 ENCOUNTER — Ambulatory Visit (HOSPITAL_COMMUNITY)
Admission: RE | Admit: 2023-10-20 | Discharge: 2023-10-20 | Disposition: A | Discharge status: Home / Self Care | Payer: PRIVATE HEALTH INSURANCE | Source: Ambulatory Visit | Attending: Vascular Surgery | Admitting: Vascular Surgery

## 2023-10-20 DIAGNOSIS — M7989 Other specified soft tissue disorders: Secondary | ICD-10-CM | POA: Diagnosis present

## 2023-10-28 ENCOUNTER — Ambulatory Visit: Payer: PRIVATE HEALTH INSURANCE | Attending: Vascular Surgery | Admitting: Physician Assistant

## 2023-10-28 VITALS — BP 134/73 | HR 72 | Temp 98.2°F | Ht 64.0 in | Wt 132.4 lb

## 2023-10-28 DIAGNOSIS — I872 Venous insufficiency (chronic) (peripheral): Secondary | ICD-10-CM

## 2023-10-28 DIAGNOSIS — I83812 Varicose veins of left lower extremities with pain: Secondary | ICD-10-CM | POA: Diagnosis not present

## 2023-10-28 DIAGNOSIS — M7989 Other specified soft tissue disorders: Secondary | ICD-10-CM

## 2023-10-28 NOTE — Progress Notes (Signed)
 Office Note     CC:  follow up Requesting Provider:  Janey Santos, MD  HPI: Theresa Mosley is a 48 y.o. (01/12/1976) female who presents for evaluation of painful varicosities of left lower extremity.  She noticed varicosities even at a young age during grade school.  She believes varicosities have worsened over the years including during pregnancy.  She denies any history of DVT, venous ulcerations, significant leg trauma, or prior vascular intervention.  She wears thigh-high compression on a regular basis especially during workdays.  She also elevates the leg when possible during the day.  She exercises on a regular basis and avoids prolonged sitting and standing.  She denies tobacco use.   Past Medical History:  Diagnosis Date   ASCUS on Pap smear 06/21/2005   Fever blister    IUD (intrauterine device) in place    Expired IUD.  Cervical stenosis.   Raynaud disease    Thyroid disease    Hypo   Varicose vein of leg     Past Surgical History:  Procedure Laterality Date   CESAREAN SECTION  2003, 2006   HYSTEROSCOPY WITH D & C  08/04/2010   MIRENA      Inserted 05-04-12   MOUTH SURGERY  03/23/1992   WISDOM TEETH    Social History   Socioeconomic History   Marital status: Married    Spouse name: Not on file   Number of children: Not on file   Years of education: Not on file   Highest education level: Not on file  Occupational History   Not on file  Tobacco Use   Smoking status: Never   Smokeless tobacco: Never  Vaping Use   Vaping status: Never Used  Substance and Sexual Activity   Alcohol  use: Yes    Alcohol /week: 0.0 standard drinks of alcohol     Comment: OCCASIONALLY   Drug use: No   Sexual activity: Yes    Birth control/protection: Other-see comments, I.U.D.    Comment: SPOUSE VASECTOMY  MIRENA  inserted 05-04-12, intercourse age 19, sexual partners  less than 5  Other Topics Concern   Not on file  Social History Narrative   Not on file   Social Drivers  of Health   Financial Resource Strain: Not on file  Food Insecurity: Not on file  Transportation Needs: Not on file  Physical Activity: Not on file  Stress: Not on file  Social Connections: Not on file  Intimate Partner Violence: Not on file    Family History  Problem Relation Age of Onset   Hypertension Mother    Stroke Father    Esophageal cancer Father    Heart attack Maternal Grandfather    Breast cancer Neg Hx    Uterine cancer Neg Hx    Ovarian cancer Neg Hx    Colon cancer Neg Hx     Current Outpatient Medications  Medication Sig Dispense Refill   estradiol (CLIMARA - DOSED IN MG/24 HR) 0.025 mg/24hr patch Estradiol     levonorgestrel  (MIRENA ) 20 MCG/24HR IUD 1 each by Intrauterine route once.     progesterone (PROMETRIUM) 200 MG capsule Progesterone     No current facility-administered medications for this visit.    Allergies  Allergen Reactions   Other Nausea And Vomiting    Walnuts cause nausea and vomiting.   Sulfa Antibiotics Nausea And Vomiting     REVIEW OF SYSTEMS:  Negative unless noted in HPI [X]  denotes positive finding, [ ]  denotes negative finding Cardiac  Comments:  Chest pain or chest pressure:    Shortness of breath upon exertion:    Short of breath when lying flat:    Irregular heart rhythm:        Vascular    Pain in calf, thigh, or hip brought on by ambulation:    Pain in feet at night that wakes you up from your sleep:     Blood clot in your veins:    Leg swelling:         Pulmonary    Oxygen at home:    Productive cough:     Wheezing:         Neurologic    Sudden weakness in arms or legs:     Sudden numbness in arms or legs:     Sudden onset of difficulty speaking or slurred speech:    Temporary loss of vision in one eye:     Problems with dizziness:         Gastrointestinal    Blood in stool:     Vomited blood:         Genitourinary    Burning when urinating:     Blood in urine:        Psychiatric    Major  depression:         Hematologic    Bleeding problems:    Problems with blood clotting too easily:        Skin    Rashes or ulcers:        Constitutional    Fever or chills:      PHYSICAL EXAMINATION:  Vitals:   10/28/23 0843  BP: 134/73  Pulse: 72  Temp: 98.2 F (36.8 C)  TempSrc: Temporal  Weight: 132 lb 6.4 oz (60.1 kg)  Height: 5' 4 (1.626 m)    General:  WDWN in NAD; vital signs documented above Gait: Not observed HENT: WNL, normocephalic Pulmonary: normal non-labored breathing Cardiac: regular HR Abdomen: soft, NT, no masses Skin: without rashes Vascular Exam/Pulses: Symmetrical DP pulses Extremities: LLE large ropey varicosities in the medial distal thigh as well as medial proximal calf; spider veins left shin Musculoskeletal: no muscle wasting or atrophy  Neurologic: A&O X 3 Psychiatric:  The pt has Normal affect.    Non-Invasive Vascular Imaging:   Left lower extremity venous reflux study negative for DVT Negative for deep reflux Incompetent GSV throughout the thigh and into the proximal calf with a diameter greater than 5 mm throughout    ASSESSMENT/PLAN:: 48 y.o. female here for evaluation of painful varicosities of the left lower extremity  Theresa Mosley is a 48 year old female with history of left lower extremity varicosities that have progressed over the years.  She has been wearing thigh-high compression on a regular basis.  She focuses on leg elevation when possible during the day.  She is active and avoids prolonged sitting and standing.  Symptoms are worsened and she is seeking treatment.  Left lower extremity venous reflux study negative for DVT.  Negative for deep reflux however she does have a incompetent and dilated GSV throughout the thigh and into the proximal calf.  She will continue the above conservative management.  We also measured and prescribed a new 20 to 30 mmHg thigh-high compression today to be worn on a regular basis.  She will return  in 3 to 4 months to be evaluated by Dr. Serene or Dr. Sheree to see if she would be a good candidate for left greater saphenous vein ablation  and stab phlebectomy.  She will notify the office in the meantime with any questions or concerns.   Donnice Sender, PA-C [facility] [phone]  Clinic MD:   [place]

## 2024-01-24 ENCOUNTER — Ambulatory Visit: Admitting: Surgery

## 2024-02-07 ENCOUNTER — Ambulatory Visit: Admitting: Surgery

## 2024-04-03 ENCOUNTER — Encounter: Payer: Self-pay | Admitting: Surgery

## 2024-04-03 ENCOUNTER — Ambulatory Visit: Payer: PRIVATE HEALTH INSURANCE | Attending: Surgery | Admitting: Surgery

## 2024-04-03 VITALS — BP 135/80 | HR 61 | Temp 97.9°F | Resp 16 | Ht 64.0 in | Wt 128.4 lb

## 2024-04-03 DIAGNOSIS — I83892 Varicose veins of left lower extremities with other complications: Secondary | ICD-10-CM

## 2024-04-03 NOTE — Progress Notes (Addendum)
 "                                    [facility]  Patient name: Theresa Mosley MRN: [id number] DOB: 1976-03-01 Sex: female   REASON FOR VISIT:    Follow up  HISOTRY OF PRESENT ILLNESS:    Theresa Mosley is a 49 y.o. female who return today for follow-up of painful varicosities in the left leg that have been present for many many years.  They did get worse during pregnancy.  She denies any history of DVT.  She has not had any prior vascular interventions.  She consistently wears thigh-high compression stockings.  She keeps her legs elevated when possible and exercises.   PAST MEDICAL HISTORY:   Past Medical History:  Diagnosis Date   ASCUS on Pap smear 06/21/2005   Fever blister    IUD (intrauterine device) in place    Expired IUD.  Cervical stenosis.   Peripheral vascular disease    Raynaud disease    Thyroid disease    Hypo   Varicose vein of leg      FAMILY HISTORY:   Family History  Problem Relation Age of Onset   Hypertension Mother    Stroke Father    Esophageal cancer Father    Heart attack Maternal Grandfather    Breast cancer Neg Hx    Uterine cancer Neg Hx    Ovarian cancer Neg Hx    Colon cancer Neg Hx     SOCIAL HISTORY:   Social History   Tobacco Use   Smoking status: Never   Smokeless tobacco: Never  Substance Use Topics   Alcohol  use: Yes    Alcohol /week: 0.0 standard drinks of alcohol     Comment: OCCASIONALLY     ALLERGIES:   Allergies[1]   CURRENT MEDICATIONS:   Current Outpatient Medications  Medication Sig Dispense Refill   estradiol (CLIMARA - DOSED IN MG/24 HR) 0.025 mg/24hr patch Estradiol     levonorgestrel  (MIRENA ) 20 MCG/24HR IUD 1 each by Intrauterine route once.     progesterone (PROMETRIUM) 200 MG capsule Progesterone     No current facility-administered medications for this visit.    REVIEW OF SYSTEMS:   [X]  denotes positive finding, [ ]  denotes negative finding Cardiac   Comments:  Chest pain or chest pressure:    Shortness of breath upon exertion:    Short of breath when lying flat:    Irregular heart rhythm:        Vascular    Pain in calf, thigh, or hip brought on by ambulation:    Pain in feet at night that wakes you up from your sleep:     Blood clot in your veins:    Leg swelling:  x       Pulmonary    Oxygen at home:    Productive cough:     Wheezing:         Neurologic    Sudden weakness in arms or legs:     Sudden numbness in arms or legs:     Sudden onset of difficulty speaking or slurred speech:    Temporary loss of vision in one eye:     Problems with dizziness:         Gastrointestinal    Blood in stool:     Vomited blood:  Genitourinary    Burning when urinating:     Blood in urine:        Psychiatric    Major depression:         Hematologic    Bleeding problems:    Problems with blood clotting too easily:        Skin    Rashes or ulcers:        Constitutional    Fever or chills:      PHYSICAL EXAM:   Vitals:   04/03/24 1019  BP: 135/80  Pulse: 61  Resp: 16  Temp: 97.9 F (36.6 C)  TempSrc: Temporal  SpO2: 100%  Weight: 128 lb 6.4 oz (58.2 kg)  Height: 5' 4 (1.626 m)    GENERAL: The patient is a well-nourished female, in no acute distress. The vital signs are documented above. CARDIAC: There is a regular rate and rhythm.  VASCULAR: SonoSite was used to evaluate the left great saphenous vein which is markedly dilated from the knee up to the groin.  There were several varicosities in the posterior side of the leg and medial calf and spider veins with trace edema. PULMONARY: Non-labored respirations SKIN: There are no ulcers or rashes noted. PSYCHIATRIC: The patient has a normal affect.  STUDIES:   I have reviewed the following venous reflux study: +--------------------------+---------+------+-----------+------------+-----  ---+  LEFT                     Reflux NoRefluxReflux  TimeDiameter  cmsComments                                     Yes                                    +--------------------------+---------+------+-----------+------------+-----  ---+  CFV                      no                                               +--------------------------+---------+------+-----------+------------+-----  ---+  FV prox                   no                                               +--------------------------+---------+------+-----------+------------+-----  ---+  Popliteal                no                                               +--------------------------+---------+------+-----------+------------+-----  ---+  GSV at [facility]                          yes    >500 ms      1.06               +--------------------------+---------+------+-----------+------------+-----  ---+  GSV prox thigh                      yes    >500 ms      1.31               +--------------------------+---------+------+-----------+------------+-----  ---+  GSV mid thigh                       yes    >500 ms      .91                +--------------------------+---------+------+-----------+------------+-----  ---+  GSV dist thigh                      yes    >500 ms      .72                +--------------------------+---------+------+-----------+------------+-----  ---+  GSV at knee                         yes    >500 ms      .60                +--------------------------+---------+------+-----------+------------+-----  ---+  GSV prox calf                       yes    >500 ms      .74                +--------------------------+---------+------+-----------+------------+-----  ---+  GSV mid calf              no                            .33                +--------------------------+---------+------+-----------+------------+-----  ---+  GSV dist calf             no                             .26                +--------------------------+---------+------+-----------+------------+-----  ---+  SSV at [facility]                no                            .36                +--------------------------+---------+------+-----------+------------+-----  ---+  SSV prox calf             no                            .38                +--------------------------+---------+------+-----------+------------+-----  ---+  SSV mid calf              no                            .26                +--------------------------+---------+------+-----------+------------+-----  ---+  Varicosities Proximal Calf          yes    >500 ms      .53                +--------------------------+---------+------+-----------+------------+-----  ---+   MEDICAL ISSUES:   CEAP class III, left leg: Theresa Mosley has been compliant with all gone operative treatment strategies including leg elevation, thigh-high compression stocking, and routine exercise, and still has complaints of pain in her varicosities as well as leg swelling.  Therefore, I think the next step is to proceed with endovenous laser ablation of the left great saphenous vein followed by 10-20 stabs to treat her varicosities and then 2 vials of sclera    Malvina New, IV, MD, FACS [facility] [phone] Pager [phone]      [1]  Allergies Allergen Reactions   Other Nausea And Vomiting    Walnuts cause nausea and vomiting.   Sulfa Antibiotics Nausea And Vomiting   "
